# Patient Record
Sex: Male | Born: 1975 | Race: White | Hispanic: No | Marital: Married | Smoking: Former smoker
Health system: Southern US, Community
[De-identification: ages and names within clinical notes are randomized; demographics above are authoritative.]

## PROBLEM LIST (undated history)

## (undated) DIAGNOSIS — I10 Essential (primary) hypertension: Secondary | ICD-10-CM

## (undated) DIAGNOSIS — F419 Anxiety disorder, unspecified: Secondary | ICD-10-CM

## (undated) DIAGNOSIS — N200 Calculus of kidney: Secondary | ICD-10-CM

## (undated) DIAGNOSIS — E785 Hyperlipidemia, unspecified: Secondary | ICD-10-CM

## (undated) DIAGNOSIS — T7840XA Allergy, unspecified, initial encounter: Secondary | ICD-10-CM

## (undated) DIAGNOSIS — T8859XA Other complications of anesthesia, initial encounter: Secondary | ICD-10-CM

## (undated) DIAGNOSIS — M1990 Unspecified osteoarthritis, unspecified site: Secondary | ICD-10-CM

## (undated) HISTORY — PX: COLONOSCOPY: SHX174

## (undated) HISTORY — DX: Hyperlipidemia, unspecified: E78.5

## (undated) HISTORY — PX: KNEE ARTHROSCOPY: SUR90

## (undated) HISTORY — PX: HYDROCELE EXCISION: SHX482

## (undated) HISTORY — DX: Allergy, unspecified, initial encounter: T78.40XA

## (undated) HISTORY — DX: Essential (primary) hypertension: I10

---

## 1999-01-17 ENCOUNTER — Emergency Department (HOSPITAL_COMMUNITY): Admission: EM | Admit: 1999-01-17 | Discharge: 1999-01-17 | Payer: Self-pay | Admitting: Emergency Medicine

## 2002-02-27 ENCOUNTER — Encounter: Payer: Self-pay | Admitting: Family Medicine

## 2002-02-27 ENCOUNTER — Encounter: Admission: RE | Admit: 2002-02-27 | Discharge: 2002-02-27 | Payer: Self-pay | Admitting: Family Medicine

## 2005-05-01 ENCOUNTER — Ambulatory Visit: Payer: Self-pay | Admitting: Family Medicine

## 2005-07-24 ENCOUNTER — Ambulatory Visit: Payer: Self-pay | Admitting: Family Medicine

## 2005-07-31 ENCOUNTER — Ambulatory Visit: Payer: Self-pay | Admitting: Family Medicine

## 2005-08-14 ENCOUNTER — Ambulatory Visit: Payer: Self-pay | Admitting: Family Medicine

## 2005-10-04 ENCOUNTER — Ambulatory Visit: Payer: Self-pay | Admitting: Family Medicine

## 2006-05-16 ENCOUNTER — Ambulatory Visit: Payer: Self-pay | Admitting: Family Medicine

## 2006-08-02 ENCOUNTER — Ambulatory Visit: Payer: Self-pay | Admitting: Family Medicine

## 2006-08-02 LAB — CONVERTED CEMR LAB
ALT: 43 units/L — ABNORMAL HIGH (ref 0–40)
AST: 32 units/L (ref 0–37)
Albumin: 4.3 g/dL (ref 3.5–5.2)
Alkaline Phosphatase: 41 units/L (ref 39–117)
BUN: 9 mg/dL (ref 6–23)
Basophils Absolute: 0 10*3/uL (ref 0.0–0.1)
Basophils Relative: 0.4 % (ref 0.0–1.0)
CO2: 31 meq/L (ref 19–32)
Calcium: 9.7 mg/dL (ref 8.4–10.5)
Chloride: 106 meq/L (ref 96–112)
Chol/HDL Ratio, serum: 4.7
Cholesterol: 161 mg/dL (ref 0–200)
Creatinine, Ser: 0.9 mg/dL (ref 0.4–1.5)
Eosinophil percent: 3.7 % (ref 0.0–5.0)
GFR calc non Af Amer: 105 mL/min
Glomerular Filtration Rate, Af Am: 127 mL/min/{1.73_m2}
Glucose, Bld: 86 mg/dL (ref 70–99)
HCT: 49.9 % (ref 39.0–52.0)
HDL: 34.3 mg/dL — ABNORMAL LOW (ref >39.0)
Hemoglobin: 16.5 g/dL (ref 13.0–17.0)
LDL Cholesterol: 111 mg/dL — ABNORMAL HIGH (ref 0–99)
Lymphocytes Relative: 36 % (ref 12.0–46.0)
MCHC: 33 g/dL (ref 30.0–36.0)
MCV: 92.7 fL (ref 78.0–100.0)
Monocytes Absolute: 0.5 10*3/uL (ref 0.2–0.7)
Monocytes Relative: 6.9 % (ref 3.0–11.0)
Neutro Abs: 3.5 10*3/uL (ref 1.4–7.7)
Neutrophils Relative %: 53 % (ref 43.0–77.0)
Platelets: 351 10*3/uL (ref 150–400)
Potassium: 4.3 meq/L (ref 3.5–5.1)
RBC: 5.38 M/uL (ref 4.22–5.81)
RDW: 11.9 % (ref 11.5–14.6)
Sodium: 142 meq/L (ref 135–145)
TSH: 1.25 microintl units/mL (ref 0.35–5.50)
Total Bilirubin: 1.4 mg/dL — ABNORMAL HIGH (ref 0.3–1.2)
Total Protein: 7.3 g/dL (ref 6.0–8.3)
Triglyceride fasting, serum: 81 mg/dL (ref 0–149)
VLDL: 16 mg/dL (ref 0–40)
WBC: 6.6 10*3/uL (ref 4.5–10.5)

## 2006-08-09 ENCOUNTER — Ambulatory Visit: Payer: Self-pay | Admitting: Family Medicine

## 2006-11-19 ENCOUNTER — Ambulatory Visit: Payer: Self-pay | Admitting: Family Medicine

## 2006-11-22 ENCOUNTER — Ambulatory Visit: Payer: Self-pay | Admitting: Family Medicine

## 2006-11-22 LAB — CONVERTED CEMR LAB
ALT: 39 units/L (ref 0–40)
AST: 28 units/L (ref 0–37)
Albumin: 3.8 g/dL (ref 3.5–5.2)
Alkaline Phosphatase: 41 units/L (ref 39–117)
BUN: 12 mg/dL (ref 6–23)
Basophils Absolute: 0.1 10*3/uL (ref 0.0–0.1)
Basophils Relative: 1.6 % — ABNORMAL HIGH (ref 0.0–1.0)
Bilirubin, Direct: 0.2 mg/dL (ref 0.0–0.3)
CO2: 30 meq/L (ref 19–32)
Calcium: 8.9 mg/dL (ref 8.4–10.5)
Chloride: 106 meq/L (ref 96–112)
Creatinine, Ser: 0.9 mg/dL (ref 0.4–1.5)
Eosinophils Absolute: 0 10*3/uL (ref 0.0–0.6)
Eosinophils Relative: 0.4 % (ref 0.0–5.0)
GFR calc Af Amer: 127 mL/min
GFR calc non Af Amer: 105 mL/min
Glucose, Bld: 102 mg/dL — ABNORMAL HIGH (ref 70–99)
HCT: 46.1 % (ref 39.0–52.0)
Hemoglobin: 15.7 g/dL (ref 13.0–17.0)
Lymphocytes Relative: 20.8 % (ref 12.0–46.0)
MCHC: 34.1 g/dL (ref 30.0–36.0)
MCV: 89.4 fL (ref 78.0–100.0)
Monocytes Absolute: 0.5 10*3/uL (ref 0.2–0.7)
Monocytes Relative: 8.5 % (ref 3.0–11.0)
Neutro Abs: 4 10*3/uL (ref 1.4–7.7)
Neutrophils Relative %: 68.7 % (ref 43.0–77.0)
Platelets: 227 10*3/uL (ref 150–400)
Potassium: 4 meq/L (ref 3.5–5.1)
RBC: 5.16 M/uL (ref 4.22–5.81)
RDW: 11.8 % (ref 11.5–14.6)
Sodium: 141 meq/L (ref 135–145)
Total Bilirubin: 0.9 mg/dL (ref 0.3–1.2)
Total Protein: 7 g/dL (ref 6.0–8.3)
WBC: 5.8 10*3/uL (ref 4.5–10.5)

## 2006-12-02 ENCOUNTER — Ambulatory Visit: Payer: Self-pay | Admitting: Family Medicine

## 2007-01-23 ENCOUNTER — Encounter: Payer: Self-pay | Admitting: Family Medicine

## 2007-01-23 DIAGNOSIS — J309 Allergic rhinitis, unspecified: Secondary | ICD-10-CM | POA: Insufficient documentation

## 2007-01-23 DIAGNOSIS — J45909 Unspecified asthma, uncomplicated: Secondary | ICD-10-CM | POA: Insufficient documentation

## 2007-01-23 DIAGNOSIS — I10 Essential (primary) hypertension: Secondary | ICD-10-CM

## 2007-01-23 DIAGNOSIS — E785 Hyperlipidemia, unspecified: Secondary | ICD-10-CM

## 2007-08-15 ENCOUNTER — Ambulatory Visit: Payer: Self-pay | Admitting: Family Medicine

## 2007-08-15 LAB — CONVERTED CEMR LAB
ALT: 57 units/L — ABNORMAL HIGH (ref 0–53)
AST: 32 units/L (ref 0–37)
Albumin: 4.2 g/dL (ref 3.5–5.2)
Alkaline Phosphatase: 39 units/L (ref 39–117)
BUN: 8 mg/dL (ref 6–23)
Basophils Absolute: 0 10*3/uL (ref 0.0–0.1)
Basophils Relative: 0.6 % (ref 0.0–1.0)
Bilirubin Urine: NEGATIVE
Bilirubin, Direct: 0.2 mg/dL (ref 0.0–0.3)
Blood in Urine, dipstick: NEGATIVE
CO2: 32 meq/L (ref 19–32)
Calcium: 9.5 mg/dL (ref 8.4–10.5)
Chloride: 103 meq/L (ref 96–112)
Cholesterol: 165 mg/dL (ref 0–200)
Creatinine, Ser: 1 mg/dL (ref 0.4–1.5)
Eosinophils Absolute: 0.2 10*3/uL (ref 0.0–0.6)
Eosinophils Relative: 3.8 % (ref 0.0–5.0)
GFR calc Af Amer: 112 mL/min
GFR calc non Af Amer: 93 mL/min
Glucose, Bld: 95 mg/dL (ref 70–99)
Glucose, Urine, Semiquant: NEGATIVE
HCT: 43.8 % (ref 39.0–52.0)
HDL: 32.1 mg/dL — ABNORMAL LOW (ref >39.0)
Hemoglobin: 15.6 g/dL (ref 13.0–17.0)
Ketones, urine, test strip: NEGATIVE
LDL Cholesterol: 114 mg/dL — ABNORMAL HIGH (ref 0–99)
Lymphocytes Relative: 44.2 % (ref 12.0–46.0)
MCHC: 35.6 g/dL (ref 30.0–36.0)
MCV: 88.3 fL (ref 78.0–100.0)
Monocytes Absolute: 0.4 10*3/uL (ref 0.2–0.7)
Monocytes Relative: 8.1 % (ref 3.0–11.0)
Neutro Abs: 1.9 10*3/uL (ref 1.4–7.7)
Neutrophils Relative %: 43.3 % (ref 43.0–77.0)
Nitrite: NEGATIVE
Platelets: 310 10*3/uL (ref 150–400)
Potassium: 4.6 meq/L (ref 3.5–5.1)
RBC: 4.96 M/uL (ref 4.22–5.81)
RDW: 11.5 % (ref 11.5–14.6)
Sodium: 141 meq/L (ref 135–145)
Specific Gravity, Urine: 1.015
TSH: 1.22 microintl units/mL (ref 0.35–5.50)
Total Bilirubin: 1 mg/dL (ref 0.3–1.2)
Total CHOL/HDL Ratio: 5.1
Total Protein: 6.7 g/dL (ref 6.0–8.3)
Triglycerides: 93 mg/dL (ref 0–149)
Urobilinogen, UA: 0.2
VLDL: 19 mg/dL (ref 0–40)
WBC Urine, dipstick: NEGATIVE
WBC: 4.4 10*3/uL — ABNORMAL LOW (ref 4.5–10.5)
pH: 8.5

## 2007-08-22 ENCOUNTER — Ambulatory Visit: Payer: Self-pay | Admitting: Family Medicine

## 2008-07-27 ENCOUNTER — Ambulatory Visit: Payer: Self-pay | Admitting: Family Medicine

## 2008-08-12 ENCOUNTER — Ambulatory Visit: Payer: Self-pay | Admitting: Family Medicine

## 2008-08-12 LAB — CONVERTED CEMR LAB
ALT: 42 units/L (ref 0–53)
AST: 23 units/L (ref 0–37)
Albumin: 4 g/dL (ref 3.5–5.2)
Alkaline Phosphatase: 43 units/L (ref 39–117)
BUN: 12 mg/dL (ref 6–23)
Basophils Absolute: 0 10*3/uL (ref 0.0–0.1)
Basophils Relative: 0 % (ref 0.0–3.0)
Bilirubin Urine: NEGATIVE
Bilirubin, Direct: 0.1 mg/dL (ref 0.0–0.3)
Blood in Urine, dipstick: NEGATIVE
CO2: 29 meq/L (ref 19–32)
Calcium: 9.4 mg/dL (ref 8.4–10.5)
Chloride: 105 meq/L (ref 96–112)
Cholesterol: 193 mg/dL (ref 0–200)
Creatinine, Ser: 0.8 mg/dL (ref 0.4–1.5)
Eosinophils Absolute: 0.1 10*3/uL (ref 0.0–0.7)
Eosinophils Relative: 1.9 % (ref 0.0–5.0)
GFR calc Af Amer: 144 mL/min
GFR calc non Af Amer: 119 mL/min
Glucose, Bld: 78 mg/dL (ref 70–99)
Glucose, Urine, Semiquant: NEGATIVE
HCT: 45.6 % (ref 39.0–52.0)
HDL: 40.3 mg/dL (ref >39.0)
Hemoglobin: 15.8 g/dL (ref 13.0–17.0)
Ketones, urine, test strip: NEGATIVE
LDL Cholesterol: 133 mg/dL — ABNORMAL HIGH (ref 0–99)
Lymphocytes Relative: 30.3 % (ref 12.0–46.0)
MCHC: 34.7 g/dL (ref 30.0–36.0)
MCV: 90 fL (ref 78.0–100.0)
Monocytes Absolute: 0.4 10*3/uL (ref 0.1–1.0)
Monocytes Relative: 6 % (ref 3.0–12.0)
Neutro Abs: 3.8 10*3/uL (ref 1.4–7.7)
Neutrophils Relative %: 61.8 % (ref 43.0–77.0)
Nitrite: NEGATIVE
Platelets: 301 10*3/uL (ref 150–400)
Potassium: 3.9 meq/L (ref 3.5–5.1)
Protein, U semiquant: NEGATIVE
RBC: 5.07 M/uL (ref 4.22–5.81)
RDW: 12.1 % (ref 11.5–14.6)
Sodium: 141 meq/L (ref 135–145)
Specific Gravity, Urine: 1.02
TSH: 0.95 microintl units/mL (ref 0.35–5.50)
Total Bilirubin: 1.2 mg/dL (ref 0.3–1.2)
Total CHOL/HDL Ratio: 4.8
Total Protein: 7.2 g/dL (ref 6.0–8.3)
Triglycerides: 100 mg/dL (ref 0–149)
Urobilinogen, UA: 0.2
VLDL: 20 mg/dL (ref 0–40)
WBC Urine, dipstick: NEGATIVE
WBC: 6.1 10*3/uL (ref 4.5–10.5)
pH: 6

## 2008-08-20 ENCOUNTER — Ambulatory Visit: Payer: Self-pay | Admitting: Family Medicine

## 2009-09-02 ENCOUNTER — Telehealth: Payer: Self-pay | Admitting: Family Medicine

## 2009-09-06 ENCOUNTER — Ambulatory Visit: Payer: Self-pay | Admitting: Family Medicine

## 2009-09-06 LAB — CONVERTED CEMR LAB
ALT: 45 units/L (ref 0–53)
AST: 29 units/L (ref 0–37)
Albumin: 4.2 g/dL (ref 3.5–5.2)
Alkaline Phosphatase: 36 units/L — ABNORMAL LOW (ref 39–117)
BUN: 7 mg/dL (ref 6–23)
Basophils Absolute: 0 10*3/uL (ref 0.0–0.1)
Basophils Relative: 1 % (ref 0.0–3.0)
Bilirubin Urine: NEGATIVE
Bilirubin, Direct: 0 mg/dL (ref 0.0–0.3)
Blood in Urine, dipstick: NEGATIVE
CO2: 33 meq/L — ABNORMAL HIGH (ref 19–32)
Calcium: 9.3 mg/dL (ref 8.4–10.5)
Chloride: 107 meq/L (ref 96–112)
Cholesterol: 158 mg/dL (ref 0–200)
Creatinine, Ser: 1 mg/dL (ref 0.4–1.5)
Eosinophils Absolute: 0.3 10*3/uL (ref 0.0–0.7)
Eosinophils Relative: 5.2 % — ABNORMAL HIGH (ref 0.0–5.0)
GFR calc non Af Amer: 91.25 mL/min (ref >60)
Glucose, Bld: 87 mg/dL (ref 70–99)
Glucose, Urine, Semiquant: NEGATIVE
HCT: 48.1 % (ref 39.0–52.0)
HDL: 40.1 mg/dL (ref >39.00)
Hemoglobin: 15.8 g/dL (ref 13.0–17.0)
Ketones, urine, test strip: NEGATIVE
LDL Cholesterol: 93 mg/dL (ref 0–99)
Lymphocytes Relative: 40.8 % (ref 12.0–46.0)
Lymphs Abs: 2 10*3/uL (ref 0.7–4.0)
MCHC: 32.8 g/dL (ref 30.0–36.0)
MCV: 91.4 fL (ref 78.0–100.0)
Monocytes Absolute: 0.3 10*3/uL (ref 0.1–1.0)
Monocytes Relative: 6.2 % (ref 3.0–12.0)
Neutro Abs: 2.3 10*3/uL (ref 1.4–7.7)
Neutrophils Relative %: 46.8 % (ref 43.0–77.0)
Nitrite: NEGATIVE
Platelets: 274 10*3/uL (ref 150.0–400.0)
Potassium: 4.6 meq/L (ref 3.5–5.1)
RBC: 5.27 M/uL (ref 4.22–5.81)
RDW: 12 % (ref 11.5–14.6)
Sodium: 143 meq/L (ref 135–145)
Specific Gravity, Urine: 1.02
TSH: 1.15 microintl units/mL (ref 0.35–5.50)
Total Bilirubin: 0.6 mg/dL (ref 0.3–1.2)
Total CHOL/HDL Ratio: 4
Total Protein: 6.8 g/dL (ref 6.0–8.3)
Triglycerides: 124 mg/dL (ref 0.0–149.0)
Urobilinogen, UA: 0.2
VLDL: 24.8 mg/dL (ref 0.0–40.0)
WBC Urine, dipstick: NEGATIVE
WBC: 4.9 10*3/uL (ref 4.5–10.5)
pH: 7.5

## 2009-09-13 ENCOUNTER — Ambulatory Visit: Payer: Self-pay | Admitting: Family Medicine

## 2010-07-19 ENCOUNTER — Ambulatory Visit
Admission: RE | Admit: 2010-07-19 | Discharge: 2010-07-19 | Payer: Self-pay | Source: Home / Self Care | Attending: Internal Medicine | Admitting: Internal Medicine

## 2010-07-19 DIAGNOSIS — J069 Acute upper respiratory infection, unspecified: Secondary | ICD-10-CM | POA: Insufficient documentation

## 2010-08-22 NOTE — Progress Notes (Signed)
Summary: REQ FOR REFILL ON MEDS  Phone Note Refill Request   Refills Requested: Medication #1:  ZOCOR 40 MG TABS 1 tab @ bedtime.  Medication #2:  CARDURA 8 MG TABS 1 tab @ bedtime Initial call taken by: Kern Reap CMA Duncan Dull),  September 02, 2009 8:52 AM Caller: Patient [phone] Reason for Call: Talk to Nurse, Talk to Doctor Summary of Call: Pt called to see if he can obtain a refill RX for meds:  DOXAZOSIN MESYLATE 4 MG TAB   -and-   SIMVASTATIN 40 MG TABLET...... Pt adv that he RX was denied because he needed to schedule an OV but pt has appt for CPX labs on 09/06/09 and appt for CPX w/  Dr. Tawanna Cooler on 09/13/2009.... Pt adv that he is completely out of these meds and would like to have RX (or atleast enough to do him till his cpx appt)  for same sent to [facility]  Initial call taken by: Debbra Riding,  September 02, 2009 8:26 AM    Prescriptions: ZOCOR 40 MG TABS (SIMVASTATIN) 1 tab @ bedtime  #30 x 0   Entered by:   Kern Reap CMA (AAMA)   Authorized by:   Roderick Pee MD   Signed by:   Kern Reap CMA (AAMA) on 09/02/2009   Method used:   Electronically to        [id number] (retail)       [address]       [place], [place]  [address]       Ph: [phone] or [phone]       Fax: [phone]   RxID:   [id number] CARDURA 8 MG TABS (DOXAZOSIN MESYLATE) 1 tab @ bedtime  #30 x 0   Entered by:   Kern Reap CMA (AAMA)   Authorized by:   Roderick Pee MD   Signed by:   Kern Reap CMA (AAMA) on 09/02/2009   Method used:   Electronically to        [id number] (retail)       [address]       [place], [place]  [address]       Ph: [phone] or [phone]       Fax: [phone]   RxID:   [id number]

## 2010-08-22 NOTE — Assessment & Plan Note (Signed)
Summary: CPX/CJR   Vital Signs:  Patient profile:   35 year old male Height:      70 inches Weight:      270 pounds BMI:     38.88 Temp:     98.9 degrees F oral BP sitting:   150 / 98  (left arm) Cuff size:   regular  Vitals Entered By: Kern Reap CMA Duncan Dull) (September 13, 2009 9:36 AM)  Reason for Visit cpx  History of Present Illness: Jim Goodwin is a 35 year old, married male, with a 20-year-old and a 52 week old at home, who comes in today for evaluation of hypertension, hyperlipidemia, and allergic rhinitis.  His hypertension, history with Cardura 8 mg nightly, BP 150/98............ BP by me 130/90.  Asked him to monitor his blood pressure daily for the next 3 to 4 weeks and fax the report.  His hyperlipidemia is managed was Zocor 40 mg nightly lipids are goal with an LDL of 93.  His allergic rhinitis history with Flonase nasal spray and Zyrtec 10 mg daily.  Tetanus booster 2007 seasonal flu 2010.  Review of systems negative  Allergies: 1)  * Duracef  Past History:  Past medical, surgical, family and social histories (including risk factors) reviewed, and no changes noted (except as noted below).  Past Medical History: Reviewed history from 01/23/2007 and no changes required. Allergic rhinitis Hypertension Hyperlipidemia Asthma  Past Surgical History: Reviewed history from 01/23/2007 and no changes required.   L  arthroscopy-98 hydrocele removed 93-94'  Family History: Reviewed history from 08/22/2007 and no changes required. Family History Hypertension father had kidney stones, one sister in good health  Social History: Reviewed history from 08/22/2007 and no changes required. Occupation: Married Never Smoked Alcohol use-no Drug use-no Regular exercise-no  Review of Systems      See HPI  Physical Exam  General:  Well-developed,well-nourished,in no acute distress; alert,appropriate and cooperative throughout examination Head:  Normocephalic and  atraumatic without obvious abnormalities. No apparent alopecia or balding. Eyes:  No corneal or conjunctival inflammation noted. EOMI. Perrla. Funduscopic exam benign, without hemorrhages, exudates or papilledema. Vision grossly normal. Ears:  External ear exam shows no significant lesions or deformities.  Otoscopic examination reveals clear canals, tympanic membranes are intact bilaterally without bulging, retraction, inflammation or discharge. Hearing is grossly normal bilaterally. Nose:  External nasal examination shows no deformity or inflammation. Nasal mucosa are pink and moist without lesions or exudates. Mouth:  Oral mucosa and oropharynx without lesions or exudates.  Teeth in good repair. Neck:  No deformities, masses, or tenderness noted. Chest Wall:  No deformities, masses, tenderness or gynecomastia noted. Breasts:  No masses or gynecomastia noted Lungs:  Normal respiratory effort, chest expands symmetrically. Lungs are clear to auscultation, no crackles or wheezes. Heart:  Normal rate and regular rhythm. S1 and S2 normal without gallop, murmur, click, rub or other extra sounds. Abdomen:  Bowel sounds positive,abdomen soft and non-tender without masses, organomegaly or hernias noted. Genitalia:  Testes bilaterally descended without nodularity, tenderness or masses. No scrotal masses or lesions. No penis lesions or urethral discharge. Msk:  No deformity or scoliosis noted of thoracic or lumbar spine.   Pulses:  R and L carotid,radial,femoral,dorsalis pedis and posterior tibial pulses are full and equal bilaterally Extremities:  No clubbing, cyanosis, edema, or deformity noted with normal full range of motion of all joints.   Neurologic:  No cranial nerve deficits noted. Station and gait are normal. Plantar reflexes are down-going bilaterally. DTRs are symmetrical throughout. Sensory, motor  and coordinative functions appear intact. Skin:  Intact without suspicious lesions or  rashes Cervical Nodes:  No lymphadenopathy noted Axillary Nodes:  No palpable lymphadenopathy Inguinal Nodes:  No significant adenopathy Psych:  Cognition and judgment appear intact. Alert and cooperative with normal attention span and concentration. No apparent delusions, illusions, hallucinations   Impression & Recommendations:  Problem # 1:  PHYSICAL EXAMINATION (ICD-V70.0) Assessment Unchanged  Orders: Prescription Created Electronically [phone])  Problem # 2:  HYPERLIPIDEMIA (ICD-272.4) Assessment: Improved  His updated medication list for this problem includes:    Zocor 40 Mg Tabs (Simvastatin) .Marland Kitchen... 1 tab @ bedtime  Orders: Prescription Created Electronically [phone]) EKG w/ Interpretation ([address])  Problem # 3:  HYPERTENSION (ICD-401.9) Assessment: Deteriorated  His updated medication list for this problem includes:    Cardura 8 Mg Tabs (Doxazosin mesylate) .Marland Kitchen... 1 tab @ bedtime  Orders: Prescription Created Electronically [phone]) EKG w/ Interpretation ([address])  Problem # 4:  ALLERGIC RHINITIS (ICD-477.9) Assessment: Improved  His updated medication list for this problem includes:    Flonase 50 Mcg/act Susp (Fluticasone propionate) ..... Spray 1 spray into both nostrils once  a day    Zyrtec Allergy 10 Mg Tabs (Cetirizine hcl) .Marland Kitchen... Take 1 tablet by mouth once a day; otc  Orders: Prescription Created Electronically [phone])  Complete Medication List: 1)  Flonase 50 Mcg/act Susp (Fluticasone propionate) .... Spray 1 spray into both nostrils once  a day 2)  Zyrtec Allergy 10 Mg Tabs (Cetirizine hcl) .... Take 1 tablet by mouth once a day; otc 3)  Cardura 8 Mg Tabs (Doxazosin mesylate) .Marland Kitchen.. 1 tab @ bedtime 4)  Zocor 40 Mg Tabs (Simvastatin) .Marland Kitchen.. 1 tab @ bedtime  Patient Instructions: 1)  check your blood pressure daily for 4 weeks in the morning.  Fax me the report at [phone], and I will call you to discuss any potential changes. 2)  If you could become pregnant,  take a multivitamin with folic acid every day. 3)  Please schedule a follow-up appointment in 1 year. Prescriptions: ZOCOR 40 MG TABS (SIMVASTATIN) 1 tab @ bedtime  #100 x 3   Entered and Authorized by:   Roderick Pee MD   Signed by:   Roderick Pee MD on 09/13/2009   Method used:   Electronically to        [id number] (retail)       [address]       [place], [place]  [address]       Ph: [phone] or [phone]       Fax: [phone]   RxID:   [id number] CARDURA 8 MG TABS (DOXAZOSIN MESYLATE) 1 tab @ bedtime  #100 x 3   Entered and Authorized by:   Roderick Pee MD   Signed by:   Roderick Pee MD on 09/13/2009   Method used:   Electronically to        [id number] (retail)       [address]       [place], [place]  [address]       Ph: [phone] or [phone]       Fax: [phone]   RxID:   [id number] FLONASE 50 MCG/ACT SUSP (FLUTICASONE PROPIONATE) Spray 1 spray into both nostrils once  a day  #3 x 4   Entered and Authorized by:   Roderick Pee MD   Signed by:   Roderick Pee MD on 09/13/2009   Method used:  Electronically to        [id number] (retail)       [address]       [place], [place]  [address]       Ph: [phone] or [phone]       Fax: [phone]   RxID:   [id number]    Immunization History:  Influenza Immunization History:    Influenza:  historical (04/22/2009)

## 2010-08-24 NOTE — Assessment & Plan Note (Signed)
Summary: cough/njr   Vital Signs:  Patient profile:   35 year old male Height:      70 inches Weight:      272 pounds BMI:     39.17 O2 Sat:      98 % on Room air Temp:     98.6 degrees F oral Pulse rate:   104 / minute BP sitting:   130 / 90  (right arm) Cuff size:   large  Vitals Entered By: Romualdo Bolk, CMA (AAMA) (July 19, 2010 3:47 PM)  O2 Flow:  Room air CC: Coughing since 12/21. Pt thinks that it may just be viral or allergy. Pt did have a slight fever at first and took coricidin hbp.   History of Present Illness: Jim Goodwin comes in today  for acute problem.  About a week ago  12 21 had onset of feverish and cold signs and now coughing and hoarse .   sinus pressure is actuallly better but having fairly dry cough   and to travel soon. NO one sick at home. Has hx of allergic rhintis but denies asthma and inhaler use  . NO tobacco . uses flonase at times not recently . sometimes gets nosebleeds but otherwise helps him .  Ex tobacco 4 years ago after 15 years.   Preventive Screening-Counseling & Management  Alcohol-Tobacco     Smoking Status: quit     Tobacco Counseling: to remain off tobacco products  Caffeine-Diet-Exercise     Does Patient Exercise: no  Comments: 2007  after 15 years  Current Medications (verified): 1)  Flonase 50 Mcg/act Susp (Fluticasone Propionate) .... Spray 1 Spray Into Both Nostrils Once  A Day 2)  Zyrtec Allergy 10 Mg Tabs (Cetirizine Hcl) .... Take 1 Tablet By Mouth Once A Day; Otc 3)  Cardura 8 Mg Tabs (Doxazosin Mesylate) .Marland Kitchen.. 1 Tab @ Bedtime 4)  Zocor 40 Mg Tabs (Simvastatin) .Marland Kitchen.. 1 Tab @ Bedtime  Allergies (verified): 1)  * Duracef  Past History:  Past medical, surgical, family and social histories (including risk factors) reviewed, and no changes noted (except as noted below).  Past Medical History: Allergic rhinitis Hypertension Hyperlipidemia Asthma  ? never rx with inhalers?   Past Surgical  History: Reviewed history from 01/23/2007 and no changes required.   L  arthroscopy-98 hydrocele removed 93-94'  Family History: Reviewed history from 08/22/2007 and no changes required. Family History Hypertension father had kidney stones, one sister in good health  Social History: Reviewed history from 08/22/2007 and no changes required. Occupation: Married  Alcohol use-no Drug use-no Regular exercise-no Former Smoker Smoking Status:  quit  Review of Systems       The patient complains of hoarseness.  The patient denies anorexia, fever, weight loss, weight gain, vision loss, decreased hearing, chest pain, dyspnea on exertion, prolonged cough, hemoptysis, enlarged lymph nodes, and angioedema.    Physical Exam  General:  midldy hoarse non toxic in nad  Head:  normocephalic and atraumatic.   Eyes:  clear  Ears:  R ear normal, L ear normal, and no external deformities.   Nose:  no external deformity and no external erythema.  congested no pain Mouth:  pharynx pink and moist.   Neck:  No deformities, masses, or tenderness noted. Lungs:  Normal respiratory effort, chest expands symmetrically. Lungs are clear to auscultation, no crackles or wheezes.no dullness.   Heart:  Normal rate and regular rhythm. S1 and S2 normal without gallop, murmur, click, rub or other  extra sounds.no lifts.   Pulses:  nl cap refill  Skin:  turgor normal, color normal, no ecchymoses, and no petechiae.   Cervical Nodes:  No lymphadenopathy noted Psych:  Oriented X3, good eye contact, not anxious appearing, and not depressed appearing.     Impression & Recommendations:  Problem # 1:  URI (ICD-465.9) prob viral with  coughing lft.sx     Expectant management and call with alarm f\symptoms  or as needed  no current wheezing or  rakes His updated medication list for this problem includes:    Zyrtec Allergy 10 Mg Tabs (Cetirizine hcl) .Marland Kitchen... Take 1 tablet by mouth once a day; otc    Hydromet 5-1.5 Mg/35ml  Syrp (Hydrocodone-homatropine) .Marland Kitchen... 1-2 tsp by mouth q4-6 hours as needed cough  Problem # 2:  ALLERGIC RHINITIS (ICD-477.9) disc use of flonase  consider  other preps if getting nose bleeds His updated medication list for this problem includes:    Flonase 50 Mcg/act Susp (Fluticasone propionate) ..... Spray 1 spray into both nostrils once  a day    Zyrtec Allergy 10 Mg Tabs (Cetirizine hcl) .Marland Kitchen... Take 1 tablet by mouth once a day; otc  Complete Medication List: 1)  Flonase 50 Mcg/act Susp (Fluticasone propionate) .... Spray 1 spray into both nostrils once  a day 2)  Zyrtec Allergy 10 Mg Tabs (Cetirizine hcl) .... Take 1 tablet by mouth once a day; otc 3)  Cardura 8 Mg Tabs (Doxazosin mesylate) .Marland Kitchen.. 1 tab @ bedtime 4)  Zocor 40 Mg Tabs (Simvastatin) .Marland Kitchen.. 1 tab @ bedtime 5)  Hydromet 5-1.5 Mg/46ml Syrp (Hydrocodone-homatropine) .Marland Kitchen.. 1-2 tsp by mouth q4-6 hours as needed cough  Patient Instructions: 1)  i think this is a viral respiratory infection. 2)  cough will probably last another week but should be feeling better after that. 3)  call if fever shortness of breath or other concerns. 4)  saline nose spray and flonase as tolerated  Prescriptions: HYDROMET 5-1.5 MG/5ML SYRP (HYDROCODONE-HOMATROPINE) 1-2 tsp by mouth q4-6 hours as needed cough  #6 oz x 0   Entered and Authorized by:   Madelin Headings MD   Signed by:   Madelin Headings MD on 07/19/2010   Method used:   Print then Give to Patient   RxID:   [id number]    Orders Added: 1)  Est. Patient Level III [62952]    Prevention & Chronic Care Immunizations   Influenza vaccine: Historical  (04/22/2009)    Tetanus booster: 07/31/2005: Td    Pneumococcal vaccine: Not documented  Other Screening   Smoking status: quit  (07/19/2010)  Lipids   Total Cholesterol: 158  (09/06/2009)   LDL: 93  (09/06/2009)   LDL Direct: Not documented   HDL: 40.10  (09/06/2009)   Triglycerides: 124.0  (09/06/2009)    SGOT (AST): 29   (09/06/2009)   SGPT (ALT): 45  (09/06/2009)   Alkaline phosphatase: 36  (09/06/2009)   Total bilirubin: 0.6  (09/06/2009)  Hypertension   Last Blood Pressure: 130 / 90  (07/19/2010)   Serum creatinine: 1.0  (09/06/2009)   Serum potassium 4.6  (09/06/2009)  Self-Management Support :    Hypertension self-management support: Not documented    Lipid self-management support: Not documented

## 2010-08-26 ENCOUNTER — Ambulatory Visit (INDEPENDENT_AMBULATORY_CARE_PROVIDER_SITE_OTHER): Payer: BC Managed Care – PPO | Admitting: Family Medicine

## 2010-08-26 ENCOUNTER — Encounter: Payer: Self-pay | Admitting: Family Medicine

## 2010-08-26 DIAGNOSIS — R109 Unspecified abdominal pain: Secondary | ICD-10-CM

## 2010-08-26 DIAGNOSIS — N200 Calculus of kidney: Secondary | ICD-10-CM | POA: Insufficient documentation

## 2010-08-26 LAB — CONVERTED CEMR LAB
Blood in Urine, dipstick: NEGATIVE
Ketones, urine, test strip: NEGATIVE
Nitrite: NEGATIVE
Specific Gravity, Urine: 1.025
Urobilinogen, UA: 0.2

## 2010-08-28 ENCOUNTER — Ambulatory Visit (INDEPENDENT_AMBULATORY_CARE_PROVIDER_SITE_OTHER): Payer: BC Managed Care – PPO | Admitting: Family Medicine

## 2010-08-28 ENCOUNTER — Encounter: Payer: Self-pay | Admitting: Family Medicine

## 2010-08-28 VITALS — BP 120/84 | Temp 98.5°F | Ht 69.0 in | Wt 270.0 lb

## 2010-08-28 DIAGNOSIS — M549 Dorsalgia, unspecified: Secondary | ICD-10-CM

## 2010-08-28 DIAGNOSIS — N200 Calculus of kidney: Secondary | ICD-10-CM

## 2010-08-28 LAB — POCT URINALYSIS DIPSTICK
Leukocytes, UA: NEGATIVE
Nitrite, UA: NEGATIVE
Protein, UA: NEGATIVE
Urobilinogen, UA: NEGATIVE
pH, UA: 6

## 2010-08-28 NOTE — Progress Notes (Signed)
  Subjective:    Patient ID: [name], male    DOB: 03/25/1976, 35 y.o.   MRN: [id number]  HPIrichard Is a 35 year old male, who comes in today for follow-up after having passed his fourth kidney stone.  He states on Friday night, about 8 p.m. He began developing flank pain.  He went to the Saturday clinic and they felt he had a kidney stone.  Interestingly enough, his urine showed no blood.  Yesterday, the pain suddenly radiates to the groin and went away.  He does not recall passing a stone.  This is his fourth stone.  The second stone to about 6 weeks to pass.  He had a urology consult at that time.  No intervention.  Review of systems otherwise negative except he doesn't drink enough water.  His urine is real dark and concentrated   Review of Systems    Negative Objective:   Physical Exam    In general, he's well-developed well-nourished, male in no acute distress    Assessment & Plan:  Kidney stone.  In the future, and going forward and drank about 24 ounces of water daily.  Return p.r.n.

## 2010-08-28 NOTE — Patient Instructions (Signed)
Drink 24 ounces of water daily.  Return p.r.n.

## 2010-08-30 NOTE — Assessment & Plan Note (Signed)
Summary: kidney stones/RCD   Vital Signs:  Patient profile:   35 year old male Weight:      272.50 pounds BMI:     39.24 Temp:     98.6 degrees F oral Pulse rate:   96 / minute Pulse rhythm:   regular BP sitting:   138 / 84  (left arm) Cuff size:   large  Vitals Entered By: Selena Batten Dance CMA Duncan Dull) (August 26, 2010 12:48 PM) CC: ? Kidney stone   History of Present Illness: has been having some muscle spasms / stomach cramps through the week  some constipated -- is better now  now pain is L flank and worse-- feels exactly like a kidney stone  trying to drink lots of water -- but a little nausea no brb in urine but it is darker than usual   took 2 advil last night - did not help much   pain is gradually getting lower  no burning with urination or frequency   this will be the 4th kidney stone -- first one was in high school  saw urologist for 2nd one -- ? name    Allergies: 1)  * Duracef  Past History:  Past Medical History: Last updated: 07/19/2010 Allergic rhinitis Hypertension Hyperlipidemia Asthma  ? never rx with inhalers?   Past Surgical History: Last updated: 01/23/2007   L  arthroscopy-98 hydrocele removed 93-94'  Family History: Last updated: 08/22/2007 Family History Hypertension father had kidney stones, one sister in good health  Social History: Last updated: 07/19/2010 Occupation: Married  Alcohol use-no Drug use-no Regular exercise-no Former Smoker  Risk Factors: Exercise: no (07/19/2010)  Risk Factors: Smoking Status: quit (07/19/2010)  Review of Systems General:  Denies chills, fever, and loss of appetite. Eyes:  Denies eye irritation. CV:  Denies chest pain or discomfort, lightheadness, and palpitations. Resp:  Denies cough and shortness of breath. GI:  Denies abdominal pain, change in bowel habits, and indigestion. GU:  Denies dysuria, genital sores, hematuria, and urinary hesitancy. MS:  Complains of mid back pain. Derm:   Denies itching, lesion(s), poor wound healing, and rash. Neuro:  Denies numbness and tingling. Heme:  Denies abnormal bruising and bleeding.  Physical Exam  General:  obese and uncomfortable appearing - leaning on exam table and pacing the room Eyes:  vision grossly intact, pupils equal, pupils round, and pupils reactive to light.   Mouth:  pharynx pink and moist, no erythema, and no exudates.   Neck:  No deformities, masses, or tenderness noted. Lungs:  diffusely distant bs without rales or rhonchi or wheeze  Heart:  Normal rate and regular rhythm. S1 and S2 normal without gallop, murmur, click, rub or other extra sounds.no lifts.   Abdomen:  no suprapubic tenderness  L flank tenderness without rebound or gaurding  no M noted  soft, normal bowel sounds, no hepatomegaly, and no splenomegaly.   Msk:  L cva tenderness - marked  no bony tenderness  tender in soft tissues of L flank  Extremities:  no cce  Skin:  Intact without suspicious lesions or rashes Cervical Nodes:  No lymphadenopathy noted Psych:  normal affect, talkative and pleasant    Impression & Recommendations:  Problem # 1:  NEPHROLITHIASIS (ICD-592.0)  recurrent with L flank pain that is colicky  interestingly no blood on ua - but clinical picture is consistent with kidney stone (recurrent) will tx with anti nausea/ pain med and hydration  if not imp in 1-2 d will need further  eval / imaging  pt inst to go to ER if not improved or if unable to drink water (or other symptoms such as fever )  see px - vicodin and phenergan given also given 50 mg IM phenergan here (pt has a driver)  inst to inc fluid intake   Orders: UA Dipstick w/o Micro (manual) ([address]) Promethazine up to 50mg  (J2550) Admin of Therapeutic Inj  intramuscular or subcutaneous (60454)  Complete Medication List: 1)  Flonase 50 Mcg/act Susp (Fluticasone propionate) .... Spray 1 spray into both nostrils once  a day 2)  Zyrtec Allergy 10 Mg Tabs  (Cetirizine hcl) .... Take 1 tablet by mouth once a day; otc 3)  Cardura 8 Mg Tabs (Doxazosin mesylate) .Marland Kitchen.. 1 tab @ bedtime 4)  Zocor 40 Mg Tabs (Simvastatin) .Marland Kitchen.. 1 tab @ bedtime 5)  Aspirin 81 Mg Tbec (Aspirin) .Marland Kitchen.. 1 by mouth once daily 6)  Vicodin Es 7.5-750 Mg Tabs (Hydrocodone-acetaminophen) .Marland Kitchen.. 1 by mouth up to every 4- 6 hours as needed severe pain 7)  Promethazine Hcl 25 Mg Tabs (Promethazine hcl) .Marland Kitchen.. 1 by mouth up to three times a day as needed nausea  Patient Instructions: 1)  if pain becomes more severe - go to ER 2)  phenergan now  3)  take vicodin for pain with caution - will sedate  4)  also oral phenergan for nausea - also can sedate  5)  call Dr Marcos Eke for appt for re check  6)  drink fluids !  Prescriptions: PROMETHAZINE HCL 25 MG TABS (PROMETHAZINE HCL) 1 by mouth up to three times a day as needed nausea  #30 x 0   Entered and Authorized by:   Judith Part MD   Signed by:   Judith Part MD on 08/26/2010   Method used:   Print then Give to Patient   RxID:   [id number] VICODIN ES 7.5-750 MG TABS (HYDROCODONE-ACETAMINOPHEN) 1 by mouth up to every 4- 6 hours as needed severe pain  #30 x 0   Entered and Authorized by:   Judith Part MD   Signed by:   Judith Part MD on 08/26/2010   Method used:   Print then Give to Patient   RxID:   [id number]    Medication Administration  Injection # 1:    Medication: Promethazine up to 50mg     Diagnosis: NEPHROLITHIASIS (ICD-592.0)    Route: IM    Site: LUOQ gluteus    Exp Date: 07/23/2011    Lot #: [id number]    Mfr: Baxter    Comments: 50mg  per Dr. Milinda Antis    Patient tolerated injection without complications    Given by: Selena Batten Dance CMA Duncan Dull) (August 26, 2010 1:55 PM)  Orders Added: 1)  UA Dipstick w/o Micro (manual) [81002] 2)  Promethazine up to 50mg  [J2550] 3)  Admin of Therapeutic Inj  intramuscular or subcutaneous [96372] 4)  Est. Patient Level IV [01027]    Current Allergies  (reviewed today): * DURACEF  Laboratory Results   Urine Tests  Date/Time Received: August 26, 2010 1:26 PM  Date/Time Reported: August 26, 2010 1:26 PM   Routine Urinalysis   Color: amber Appearance: Clear Glucose: negative   (Normal Range: Negative) Bilirubin: negative   (Normal Range: Negative) Ketone: negative   (Normal Range: Negative) Spec. Gravity: 1.025   (Normal Range: 1.003-1.035) Blood: negative   (Normal Range: Negative) pH: 6.0   (Normal Range: 5.0-8.0) Protein: trace   (Normal  Range: Negative) Urobilinogen: 0.2   (Normal Range: 0-1) Nitrite: negative   (Normal Range: Negative) Leukocyte Esterace: negative   (Normal Range: Negative)

## 2010-09-07 ENCOUNTER — Other Ambulatory Visit: Payer: Self-pay | Admitting: *Deleted

## 2010-09-07 MED ORDER — FLUTICASONE PROPIONATE 50 MCG/ACT NA SUSP: 1.0000 | Freq: Every day | NASAL | Status: DC

## 2010-09-15 ENCOUNTER — Other Ambulatory Visit: Payer: BC Managed Care – PPO

## 2010-09-15 DIAGNOSIS — Z0000 Encounter for general adult medical examination without abnormal findings: Secondary | ICD-10-CM

## 2010-09-15 LAB — CBC WITH DIFFERENTIAL/PLATELET
Eosinophils Relative: 4.6 % (ref 0.0–5.0)
HCT: 46.6 % (ref 39.0–52.0)
Hemoglobin: 15.7 g/dL (ref 13.0–17.0)
Lymphs Abs: 2.4 10*3/uL (ref 0.7–4.0)
MCV: 91.5 fl (ref 78.0–100.0)
Monocytes Absolute: 0.3 10*3/uL (ref 0.1–1.0)
Neutro Abs: 2.5 10*3/uL (ref 1.4–7.7)
Platelets: 314 10*3/uL (ref 150.0–400.0)
WBC: 5.5 10*3/uL (ref 4.5–10.5)

## 2010-09-15 LAB — BASIC METABOLIC PANEL
BUN: 9 mg/dL (ref 6–23)
Chloride: 106 mEq/L (ref 96–112)
Glucose, Bld: 84 mg/dL (ref 70–99)
Potassium: 4.7 mEq/L (ref 3.5–5.1)

## 2010-09-15 LAB — HEPATIC FUNCTION PANEL
ALT: 44 U/L (ref 0–53)
Albumin: 4.2 g/dL (ref 3.5–5.2)
Total Bilirubin: 0.8 mg/dL (ref 0.3–1.2)
Total Protein: 6.6 g/dL (ref 6.0–8.3)

## 2010-09-15 LAB — LIPID PANEL
Cholesterol: 148 mg/dL (ref 0–200)
Triglycerides: 93 mg/dL (ref 0.0–149.0)

## 2010-09-15 LAB — TSH: TSH: 1.47 u[IU]/mL (ref 0.35–5.50)

## 2010-09-15 LAB — POCT URINALYSIS DIPSTICK
Bilirubin, UA: NEGATIVE
Blood, UA: NEGATIVE
Glucose, UA: NEGATIVE
Ketones, UA: NEGATIVE
Spec Grav, UA: 1.02

## 2010-09-22 ENCOUNTER — Ambulatory Visit (INDEPENDENT_AMBULATORY_CARE_PROVIDER_SITE_OTHER): Payer: BC Managed Care – PPO | Admitting: Family Medicine

## 2010-09-22 ENCOUNTER — Encounter: Payer: Self-pay | Admitting: Family Medicine

## 2010-09-22 DIAGNOSIS — J309 Allergic rhinitis, unspecified: Secondary | ICD-10-CM

## 2010-09-22 DIAGNOSIS — E785 Hyperlipidemia, unspecified: Secondary | ICD-10-CM

## 2010-09-22 DIAGNOSIS — I10 Essential (primary) hypertension: Secondary | ICD-10-CM

## 2010-09-22 MED ORDER — SIMVASTATIN 40 MG PO TABS: 40.0000 mg | ORAL_TABLET | Freq: Every day | ORAL | Status: DC

## 2010-09-22 MED ORDER — FLUTICASONE PROPIONATE 50 MCG/ACT NA SUSP: 1.0000 | Freq: Every day | NASAL | Status: DC

## 2010-09-22 MED ORDER — DOXAZOSIN MESYLATE 8 MG PO TABS: 8.0000 mg | ORAL_TABLET | Freq: Every day | ORAL | Status: DC

## 2010-09-22 NOTE — Patient Instructions (Signed)
Continue medications follow-up in one year

## 2010-09-22 NOTE — Progress Notes (Signed)
  Subjective:    Patient ID: Jim Goodwin, male    DOB: 1976-04-29, 35 y.o.   MRN: [id number]  HPI Jim Goodwin is a 35 year old, married male, nonsmoker, who comes in today for general physical examination because of a history of hypertension, hyperlipidemia, and allergic rhinitis.  His hypertension is treated with Cardura 8 mg nightly BP at home 120/80.  His allergic rhinitis history with Flonase nasal spray along with OTC Zyrtec.  His hyperlipidemia is treated with simvastatin 40 mg daily.  Lipids are at goal with an LDL of 93.  He continues to struggle with his weight is 273 pounds at 35 inches tall   Review of Systems  Constitutional: Negative.   HENT: Negative.   Eyes: Negative.   Respiratory: Negative.   Cardiovascular: Negative.   Gastrointestinal: Negative.   Genitourinary: Negative.   Musculoskeletal: Negative.   Skin: Negative.   Neurological: Negative.   Hematological: Negative.   Psychiatric/Behavioral: Negative.             Physical Exam  Constitutional: He is oriented to person, place, and time. He appears well-developed and well-nourished.  HENT:  Head: Normocephalic and atraumatic.  Right Ear: External ear normal.  Left Ear: External ear normal.  Nose: Nose normal.  Mouth/Throat: Oropharynx is clear and moist.  Eyes: Conjunctivae and EOM are normal. Pupils are equal, round, and reactive to light.  Neck: Normal range of motion. Neck supple. No JVD present. No tracheal deviation present. No thyromegaly present.  Cardiovascular: Normal rate, regular rhythm, normal heart sounds and intact distal pulses.  Exam reveals no gallop and no friction rub.   No murmur heard. Pulmonary/Chest: Effort normal and breath sounds normal. No stridor. No respiratory distress. He has no wheezes. He has no rales. He exhibits no tenderness.  Abdominal: Soft. Bowel sounds are normal. He exhibits no distension and no mass. There is no tenderness. There is no rebound and no  guarding.  Genitourinary: Rectum normal, prostate normal and penis normal. Guaiac negative stool. No penile tenderness.  Musculoskeletal: Normal range of motion. He exhibits no edema and no tenderness.  Lymphadenopathy:    He has no cervical adenopathy.  Neurological: He is alert and oriented to person, place, and time. He has normal reflexes. No cranial nerve deficit. He exhibits normal muscle tone.  Skin: Skin is warm and dry. No rash noted. No erythema. No pallor.  Psychiatric: He has a normal mood and affect. His behavior is normal. Judgment and thought content normal.          Assessment & Plan:  Hypertension continue Cardura 8 mg daily.  Allergic rhinitis.  Continue Flonase and OTC, Zyrtec.  Hyperlipidemia.  Continue Zocor 40 nightly

## 2011-05-21 ENCOUNTER — Ambulatory Visit (INDEPENDENT_AMBULATORY_CARE_PROVIDER_SITE_OTHER): Payer: BC Managed Care – PPO | Admitting: Family Medicine

## 2011-05-21 ENCOUNTER — Encounter: Payer: Self-pay | Admitting: Family Medicine

## 2011-05-21 VITALS — BP 140/98 | Temp 98.0°F | Wt 275.0 lb

## 2011-05-21 DIAGNOSIS — M549 Dorsalgia, unspecified: Secondary | ICD-10-CM

## 2011-05-21 MED ORDER — CYCLOBENZAPRINE HCL 5 MG PO TABS: 5.0000 mg | ORAL_TABLET | Freq: Three times a day (TID) | ORAL | Status: AC | PRN

## 2011-05-21 MED ORDER — TRAMADOL HCL 50 MG PO TABS: ORAL_TABLET | ORAL | Status: DC

## 2011-05-21 NOTE — Progress Notes (Signed)
  Subjective:    Patient ID: Jim Goodwin, male    DOB: June 23, 1976, 35 y.o.   MRN: [id number]  HPIRichard is a 35 year old, married male, nonsmoker, who comes in today for evaluation of right lumbar back pain x 4 months.  He states about 4 months ago he began having episodes of severe right lumbar back pain.  He describes the pain is sometimes dull, sometimes sharp.  A5 on a scale of one to 10.  Does not radiate.  It sometimes lasts for a few seconds sometimes lasts minutes or hours.  His weight is 275 pounds.  He has a sedentary job.  He sits all day and does not exercise on a regular basis.  No history of trauma    Review of Systems    General neurologic and musculoskeletal review of systems otherwise negative.  Specifically, no bowel nor bladder dysfunction Objective:   Physical Exam  Overweight male in no acute distress.  Examination of the spine shows it to be straight and no palpable tenderness.  There is no palpable tenderness in the back.  In the supine position.  His sensation, strength, and reflexes are normal.  Straight leg raising positive at 75 degrees, however, he has extremely tight hamstrings      Assessment & Plan:  Mechanical low back pain.  Plan Motrin, 600 b.i.d., Flexeril, and tramadol, nightly, p.r.n., and most importantly, a physical therapy consult for evaluation, treatment

## 2011-05-21 NOTE — Patient Instructions (Signed)
Take 600 mg of Motrin twice daily.  Flexeril and tramadol............ One half of each at bedtime as needed for severe pain.  We will get to set up for a physical therapy consult ASAP.  Return to see me p.r.n.

## 2011-07-24 DIAGNOSIS — J189 Pneumonia, unspecified organism: Secondary | ICD-10-CM

## 2011-07-24 HISTORY — DX: Pneumonia, unspecified organism: J18.9

## 2011-09-17 ENCOUNTER — Ambulatory Visit (INDEPENDENT_AMBULATORY_CARE_PROVIDER_SITE_OTHER): Payer: BC Managed Care – PPO | Admitting: Family Medicine

## 2011-09-17 ENCOUNTER — Encounter (HOSPITAL_COMMUNITY): Payer: Self-pay

## 2011-09-17 ENCOUNTER — Inpatient Hospital Stay (HOSPITAL_COMMUNITY)
Admission: EM | Admit: 2011-09-17 | Discharge: 2011-09-22 | DRG: 541 | Disposition: A | Discharge status: Home / Self Care | Payer: BC Managed Care – PPO | Attending: Internal Medicine | Admitting: Internal Medicine

## 2011-09-17 ENCOUNTER — Other Ambulatory Visit: Payer: BC Managed Care – PPO

## 2011-09-17 ENCOUNTER — Emergency Department (HOSPITAL_COMMUNITY): Payer: BC Managed Care – PPO

## 2011-09-17 ENCOUNTER — Encounter: Payer: Self-pay | Admitting: Family Medicine

## 2011-09-17 DIAGNOSIS — E871 Hypo-osmolality and hyponatremia: Secondary | ICD-10-CM | POA: Diagnosis present

## 2011-09-17 DIAGNOSIS — J189 Pneumonia, unspecified organism: Principal | ICD-10-CM | POA: Diagnosis present

## 2011-09-17 DIAGNOSIS — J309 Allergic rhinitis, unspecified: Secondary | ICD-10-CM

## 2011-09-17 DIAGNOSIS — Z7982 Long term (current) use of aspirin: Secondary | ICD-10-CM

## 2011-09-17 DIAGNOSIS — N179 Acute kidney failure, unspecified: Secondary | ICD-10-CM | POA: Diagnosis present

## 2011-09-17 DIAGNOSIS — R071 Chest pain on breathing: Secondary | ICD-10-CM

## 2011-09-17 DIAGNOSIS — M129 Arthropathy, unspecified: Secondary | ICD-10-CM | POA: Diagnosis present

## 2011-09-17 DIAGNOSIS — E785 Hyperlipidemia, unspecified: Secondary | ICD-10-CM

## 2011-09-17 DIAGNOSIS — I10 Essential (primary) hypertension: Secondary | ICD-10-CM

## 2011-09-17 DIAGNOSIS — R509 Fever, unspecified: Secondary | ICD-10-CM

## 2011-09-17 DIAGNOSIS — Z79899 Other long term (current) drug therapy: Secondary | ICD-10-CM

## 2011-09-17 DIAGNOSIS — M549 Dorsalgia, unspecified: Secondary | ICD-10-CM

## 2011-09-17 HISTORY — DX: Unspecified osteoarthritis, unspecified site: M19.90

## 2011-09-17 HISTORY — DX: Calculus of kidney: N20.0

## 2011-09-17 LAB — CBC
HCT: 43.2 % (ref 39.0–52.0)
Hemoglobin: 15 g/dL (ref 13.0–17.0)
MCH: 30.2 pg (ref 26.0–34.0)
MCV: 87.1 fL (ref 78.0–100.0)
RBC: 4.96 MIL/uL (ref 4.22–5.81)
WBC: 22.3 10*3/uL — ABNORMAL HIGH (ref 4.0–10.5)

## 2011-09-17 LAB — BASIC METABOLIC PANEL
BUN: 29 mg/dL — ABNORMAL HIGH (ref 6–23)
CO2: 25 mEq/L (ref 19–32)
Calcium: 9.3 mg/dL (ref 8.4–10.5)
Creatinine, Ser: 1.39 mg/dL — ABNORMAL HIGH (ref 0.50–1.35)
Glucose, Bld: 126 mg/dL — ABNORMAL HIGH (ref 70–99)
Sodium: 134 mEq/L — ABNORMAL LOW (ref 135–145)

## 2011-09-17 LAB — BLOOD GAS, ARTERIAL
Acid-base deficit: 2.2 mmol/L — ABNORMAL HIGH (ref 0.0–2.0)
Bicarbonate: 22.4 mEq/L (ref 20.0–24.0)
O2 Saturation: 95.5 %
Patient temperature: 98.6
TCO2: 19.7 mmol/L (ref 0–100)
pH, Arterial: 7.365 (ref 7.350–7.450)

## 2011-09-17 LAB — DIFFERENTIAL
Eosinophils Relative: 0 % (ref 0–5)
Lymphocytes Relative: 3 % — ABNORMAL LOW (ref 12–46)
Lymphs Abs: 0.8 10*3/uL (ref 0.7–4.0)
Monocytes Absolute: 1.3 10*3/uL — ABNORMAL HIGH (ref 0.1–1.0)
Monocytes Relative: 6 % (ref 3–12)

## 2011-09-17 MED ORDER — CYCLOBENZAPRINE HCL 5 MG PO TABS
5.0000 mg | ORAL_TABLET | Freq: Three times a day (TID) | ORAL | Status: DC | PRN
  Filled 2011-09-17: qty 1

## 2011-09-17 MED ORDER — MORPHINE SULFATE 2 MG/ML IJ SOLN
1.0000 mg | Freq: Once | INTRAMUSCULAR | Status: AC
  Administered 2011-09-17: 22:00:00 via INTRAVENOUS
  Filled 2011-09-17: qty 1

## 2011-09-17 MED ORDER — KETOROLAC TROMETHAMINE 30 MG/ML IJ SOLN
30.0000 mg | Freq: Once | INTRAMUSCULAR | Status: AC
  Administered 2011-09-17: 30 mg via INTRAVENOUS
  Filled 2011-09-17: qty 1

## 2011-09-17 MED ORDER — LEVALBUTEROL HCL 0.63 MG/3ML IN NEBU
0.6300 mg | INHALATION_SOLUTION | RESPIRATORY_TRACT | Status: DC | PRN
  Administered 2011-09-17: 0.63 mg via RESPIRATORY_TRACT
  Filled 2011-09-17: qty 3

## 2011-09-17 MED ORDER — MORPHINE SULFATE 4 MG/ML IJ SOLN
4.0000 mg | Freq: Once | INTRAMUSCULAR | Status: AC
  Administered 2011-09-17: 4 mg via INTRAVENOUS
  Filled 2011-09-17: qty 1

## 2011-09-17 MED ORDER — HYDROCODONE-ACETAMINOPHEN 5-325 MG PO TABS
1.0000 | ORAL_TABLET | ORAL | Status: DC | PRN
  Administered 2011-09-17 – 2011-09-22 (×12): 1 via ORAL
  Filled 2011-09-17 (×12): qty 1

## 2011-09-17 MED ORDER — OXYCODONE-ACETAMINOPHEN 5-325 MG PO TABS: 1.0000 | ORAL_TABLET | Freq: Three times a day (TID) | ORAL | Status: AC | PRN

## 2011-09-17 MED ORDER — HYDROMORPHONE HCL PF 1 MG/ML IJ SOLN
INTRAMUSCULAR | Status: AC
  Filled 2011-09-17: qty 1

## 2011-09-17 MED ORDER — SIMVASTATIN 40 MG PO TABS
40.0000 mg | ORAL_TABLET | Freq: Every day | ORAL | Status: DC
  Administered 2011-09-17 – 2011-09-21 (×5): 40 mg via ORAL
  Filled 2011-09-17 (×6): qty 1

## 2011-09-17 MED ORDER — DEXTROSE 5 % IV SOLN
500.0000 mg | Freq: Once | INTRAVENOUS | Status: AC
  Administered 2011-09-17: 500 mg via INTRAVENOUS
  Filled 2011-09-17: qty 500

## 2011-09-17 MED ORDER — MORPHINE SULFATE 4 MG/ML IJ SOLN
4.0000 mg | Freq: Once | INTRAMUSCULAR | Status: AC
  Administered 2011-09-17: 4 mg via INTRAVENOUS

## 2011-09-17 MED ORDER — SODIUM CHLORIDE 0.9 % IV BOLUS (SEPSIS): 1000.0000 mL | Freq: Once | INTRAVENOUS | Status: DC

## 2011-09-17 MED ORDER — ASPIRIN EC 81 MG PO TBEC
81.0000 mg | DELAYED_RELEASE_TABLET | Freq: Every day | ORAL | Status: DC
  Administered 2011-09-17 – 2011-09-22 (×6): 81 mg via ORAL
  Filled 2011-09-17 (×6): qty 1

## 2011-09-17 MED ORDER — HYDROMORPHONE HCL PF 1 MG/ML IJ SOLN
0.5000 mg | INTRAMUSCULAR | Status: DC | PRN
  Administered 2011-09-18: 0.5 mg via INTRAVENOUS
  Administered 2011-09-18: 17:00:00 via INTRAVENOUS
  Administered 2011-09-18 – 2011-09-20 (×6): 0.5 mg via INTRAVENOUS
  Administered 2011-09-20: 16:00:00 via INTRAVENOUS
  Administered 2011-09-21: 0.5 mg via INTRAVENOUS
  Administered 2011-09-21: 1 mg via INTRAVENOUS
  Administered 2011-09-21 (×2): 0.5 mg via INTRAVENOUS
  Filled 2011-09-17 (×14): qty 1

## 2011-09-17 MED ORDER — SODIUM CHLORIDE 0.9 % IV SOLN
INTRAVENOUS | Status: DC
  Administered 2011-09-17 – 2011-09-18 (×4): via INTRAVENOUS
  Administered 2011-09-19 (×2): 1000 mL via INTRAVENOUS
  Administered 2011-09-20 (×3): via INTRAVENOUS

## 2011-09-17 MED ORDER — SODIUM CHLORIDE 0.9 % IV BOLUS (SEPSIS)
1000.0000 mL | Freq: Once | INTRAVENOUS | Status: AC
  Administered 2011-09-17: 1000 mL via INTRAVENOUS

## 2011-09-17 MED ORDER — ONDANSETRON HCL 4 MG/2ML IJ SOLN: 4.0000 mg | Freq: Three times a day (TID) | INTRAMUSCULAR | Status: DC | PRN

## 2011-09-17 MED ORDER — MORPHINE SULFATE 25 MG/ML IV SOLN: 1.0000 mg/h | Freq: Once | INTRAVENOUS | Status: DC

## 2011-09-17 MED ORDER — KETOROLAC TROMETHAMINE 60 MG/2ML IM SOLN
60.0000 mg | Freq: Once | INTRAMUSCULAR | Status: AC
  Administered 2011-09-17: 60 mg via INTRAMUSCULAR

## 2011-09-17 MED ORDER — DOXYCYCLINE HYCLATE 100 MG PO TABS: 100.0000 mg | ORAL_TABLET | Freq: Once | ORAL | Status: DC

## 2011-09-17 MED ORDER — ACETAMINOPHEN 325 MG PO TABS
ORAL_TABLET | ORAL | Status: AC
  Administered 2011-09-17: 650 mg
  Filled 2011-09-17: qty 2

## 2011-09-17 MED ORDER — SODIUM CHLORIDE 0.9 % IV SOLN: INTRAVENOUS | Status: DC

## 2011-09-17 MED ORDER — LEVOFLOXACIN IN D5W 500 MG/100ML IV SOLN
500.0000 mg | INTRAVENOUS | Status: DC
  Administered 2011-09-17: 500 mg via INTRAVENOUS
  Filled 2011-09-17 (×2): qty 100

## 2011-09-17 MED ORDER — HYDROMORPHONE HCL PF 1 MG/ML IJ SOLN
0.5000 mg | INTRAMUSCULAR | Status: DC | PRN
  Administered 2011-09-17: 0.5 mg via INTRAVENOUS
  Filled 2011-09-17: qty 1

## 2011-09-17 MED ORDER — FLUTICASONE PROPIONATE 50 MCG/ACT NA SUSP
1.0000 | Freq: Every day | NASAL | Status: DC
  Administered 2011-09-18 – 2011-09-22 (×4): 1 via NASAL
  Filled 2011-09-17: qty 16

## 2011-09-17 MED ORDER — DEXTROSE 5 % IV SOLN
1.0000 g | Freq: Once | INTRAVENOUS | Status: AC
  Administered 2011-09-17: 1 g via INTRAVENOUS
  Filled 2011-09-17: qty 10

## 2011-09-17 NOTE — Progress Notes (Signed)
Patient is working really hard to breath. Respirations at 23 and shallow. MD notified.

## 2011-09-17 NOTE — Progress Notes (Signed)
  Subjective:    Patient ID: Jim Goodwin, male    DOB: [birth year], 36 y.o.   MRN: [id number]  HPI Jim Goodwin is a 36 year old married male nonsmoker who comes in today accompanied by his wife because of severe chest pain inability to take a deep breath and fever and chills for 2 days  He awoke on Saturday evening with fever chills and aching all over and slight cough. Yesterday he developed severe right upper chest pain. The pain on a scale of 1-10 is a 15.   Review of Systems General and pulmonary review of systems otherwise negative    Objective:   Physical Exam  Well-developed slightly overweight male in acute pain HEENT negative neck was supple no adenopathy unable to do a pulmonary exacerbation because he can't take a deep breath      Assessment & Plan:  Fever chills chest pain probable viral syndrome with pleurisy rule out pneumonia send for a chest x-ray stat

## 2011-09-17 NOTE — ED Notes (Signed)
   Report called nurse unavailable will call back

## 2011-09-17 NOTE — Patient Instructions (Signed)
Go directly to the main Ofc. for a chest x-ray  Then go home and I will call you the report

## 2011-09-17 NOTE — ED Notes (Addendum)
Worked  Around unit > 100 feet slight SOB Pox 91-95 hr 120-130 EDPA aware

## 2011-09-17 NOTE — H&P (Signed)
History and Physical  Jim Goodwin:[id number] DOB: [birth year] DOA: 09/17/2011  Referring physician: Cathren Laine, MD PCP: Evette Georges, MD, MD   Chief Complaint: Shortness of breath  HPI:  36 year old man presents the emergency department with three-day history of fever, cough, shortness of breath. Symptoms began 3 days ago he developed fever up to 102.9. He developed a worsening productive cough with blood-tinged sputum. Associated symptoms include severe pleuritic chest pain, chills, sweats and sore throat. The parents described as stabbing in quality. Positive sick contacts with his wife and children who had upper respiratory tract infections. Patient reports several episodes of diarrhea yesterday.  Patient was seen by his primary care physician today and referred to the emergency department for chest x-ray. Workup in the emergency department was notable for significant pneumonia as well as severe pain and tachycardia. She was referred to the emergency department for further evaluation and treatment.  Review of Systems:  Negative changes to his vision, rash, new muscle aches, bleeding, dysuria, abdominal pain. Otherwise as above. No previous history of pneumonia or pulmonary problems. He did smoke for approximately 15 years but quit 5 years ago.  Past Medical History  Diagnosis Date  . Allergy   . Hypertension   . Hyperlipidemia   . Arthritis    Past Surgical History  Procedure Date  . Knee arthroscopy     left  . Hydrocele excision     93-94   Social History:  reports that he quit smoking about 5 years ago. He does not have any smokeless tobacco history on file. He reports that he drinks about 1.2 ounces of alcohol per week. He reports that he does not use illicit drugs.  Allergies  Allergen Reactions  . Duracillin As (Penicillin G Procaine)     GI upset    Family History  Problem Relation Age of Onset  . Kidney disease Father   . Hypertension Other      Prior to Admission medications   Medication Sig Start Date End Date Taking? Authorizing Provider  aspirin 81 MG EC tablet Take 81 mg by mouth daily.     Yes Historical Provider, MD  cetirizine (ZYRTEC) 10 MG tablet Take 10 mg by mouth daily.     Yes Historical Provider, MD  cyclobenzaprine (FLEXERIL) 5 MG tablet Take 1 tablet (5 mg total) by mouth every 8 (eight) hours as needed for muscle spasms. 05/21/11 05/20/12 Yes Evette Georges, MD  doxazosin (CARDURA) 8 MG tablet Take 1 tablet (8 mg total) by mouth at bedtime. 09/22/10  Yes Evette Georges, MD  fluticasone [facility]) 50 MCG/ACT nasal spray Place 1 spray into the nose daily. As needed for additional allergy relief. 09/22/10  Yes Evette Georges, MD  simvastatin (ZOCOR) 40 MG tablet Take 1 tablet (40 mg total) by mouth at bedtime. 09/22/10  Yes Evette Georges, MD  traMADol (ULTRAM) 50 MG tablet One half to one tablet at bedtime as needed for pain/spasm. 05/21/11  Yes Evette Georges, MD  oxyCODONE-acetaminophen (ROXICET) 5-325 MG per tablet Take 1 tablet by mouth every 8 (eight) hours as needed for pain. 09/17/11 09/27/11  Evette Georges, MD   Physical Exam: Filed Vitals:   09/17/11 1025 09/17/11 1157  BP: 124/85 113/72  Pulse: 134 117  Temp: 100.4 F (38 C) 99.5 F (37.5 C)  TempSrc: Oral Oral  Resp: 22 15  SpO2: 95% 98%     General:  Appears moderately ill with tachypnea, dyspnea and severe  pain. He has however nontoxic after administration of morphine.  Eyes: Pupils equal, round, react to light. Normal lids, irises, and a  ENT: Grossly normal hearing. Normal lips and tongue.  Neck: No lymphadenopathy or masses. No thyromegaly appreciated  Cardiovascular: Tachycardic, regular rhythm. No murmur, rub, gallop.  Telemetry: Sinus tachycardia rate 130s to 140s.  Respiratory: Clear to auscultation on the left. Right with decreased breath sounds but no frank rhonchi, wheezes or rales. Shallow breathing secondary  to pain with respiratory rate in the 40s with pain. After demonstration of morphine respiratory rate in the high 20s.  Abdomen: Soft, nontender, nondistended.  Skin: Grossly unremarkable.  Musculoskeletal: Grossly unremarkable.  Psychiatric: Grossly normal mood and affect. Speech fluent and appropriate.  Labs on Admission:  Basic Metabolic Panel:  Lab 09/17/11 4540  NA 134*  K 4.1  CL 97  CO2 25  GLUCOSE 126*  BUN 29*  CREATININE 1.39*  CALCIUM 9.3  MG --  PHOS --   CBC:  Lab 09/17/11 1125  WBC 22.3*  NEUTROABS 20.2*  HGB 15.0  HCT 43.2  MCV 87.1  PLT 212   Radiological Exams on Admission: Dg Chest 2 View  09/17/2011  *RADIOLOGY REPORT*  Clinical Data: Fever, right chest pain, cough  CHEST - 2 VIEW  Comparison: 11/22/2006  Findings: Focal/patchy right mid lung opacity, possibly reflecting RUL pneumonia.  Additional patchy right basilar opacity with associated volume loss, atelectasis versus pneumonia. No pleural effusion or pneumothorax.  Cardiomediastinal silhouette is within normal limits.  Mild degenerative changes of the visualized thoracolumbar spine.  IMPRESSION: Focal/patchy right midlung opacity, possibly reflecting right upper lobe pneumonia.  Given focal appearance, follow-up chest radiographs are suggested to document resolution and exclude underlying mass.  Additional patchy right basilar opacity with associated volume loss, atelectasis versus pneumonia.  Original Report Authenticated By: Charline Bills, M.D.   Assessment/Plan 1. Community-acquired pneumonia: Associated symptoms include fever, tachycardia and tachypnea. Some diarrhea raises the question of Legionella. Empiric therapy with Levaquin. 2. Acute renal failure: IV fluids. Secondary to poor oral intake in the context of acute illness. 3. Hypertension: Appears stable at this point. 4. Hyperlipidemia: Stable.  At this point will admit to telemetry. Upon reevaluation the patient appears  improved.  Code Status: Full Family Communication: Discussed with his wife at bedside. All questions answered to her apparent satisfaction Disposition Plan: Home when improved.  Brendia Sacks, MD  [facility] Pager [phone] 09/17/2011, 2:36 PM

## 2011-09-17 NOTE — Progress Notes (Addendum)
Patient is having pain unrelieved by vicodin. MD notified. New order given.

## 2011-09-17 NOTE — ED Notes (Signed)
Patient transported to X-ray 

## 2011-09-17 NOTE — Progress Notes (Signed)
Pt asking about his Cardura and his heart rate is 125-127.  Text page sent to Dr Blake Divine

## 2011-09-17 NOTE — ED Notes (Signed)
Pt sent from Dr. Tawanna Cooler office d/t ?PNA, states was given a "pain shot"; pt c/o pain to rt shoulder/chest/ribs from coughing, also has a fever, blood in mucus, diarrhrea.

## 2011-09-17 NOTE — ED Provider Notes (Signed)
History     CSN: [id number]  Arrival date & time 09/17/11  1017   First MD Initiated Contact with Patient 09/17/11 1027      Chief Complaint  Patient presents with  . Cough    URI    (Consider location/radiation/quality/duration/timing/severity/associated sxs/prior treatment) Patient is a 37 y.o. male presenting with cough. The history is provided by the patient and the spouse.  Cough This is a new problem. The current episode started yesterday. The problem occurs constantly. The problem has been gradually worsening. The cough is productive of blood-tinged sputum. The maximum temperature recorded prior to his arrival was 102 to 102.9 F. The fever has been present for 1 to 2 days. Associated symptoms include chest pain, chills, sweats, sore throat and shortness of breath. Pertinent negatives include no weight loss, no headaches, no rhinorrhea, no myalgias, no wheezing and no eye redness. He has tried decongestants for the symptoms. The treatment provided no relief. Smoker: former smoker.   35yo WM with hx HTN, HLD presents with fever and cough. Fever and chills started on Saturday; Tmax has been 102. Has been taking APAP/ibu at home for this. The cough started yesterday and has been productive of blood tinged sputum. He has associated right sided stabbing chest pain with deep breathing and coughing. No rhinorrhea. Denies n/v/d. Wife and children have been ill with URI sx, low grade fever.  Past Medical History  Diagnosis Date  . Allergy   . Hypertension   . Hyperlipidemia   . Arthritis     Past Surgical History  Procedure Date  . Knee arthroscopy     left  . Hydrocele excision     93-94    Family History  Problem Relation Age of Onset  . Kidney disease Father   . Hypertension Other     History  Substance Use Topics  . Smoking status: Former Smoker    Quit date: 07/23/2006  . Smokeless tobacco: Not on file  . Alcohol Use: 1.2 oz/week    1 Cans of beer, 1 Shots of  liquor per week      Review of Systems  Constitutional: Positive for fever and chills. Negative for weight loss, diaphoresis, activity change, appetite change and fatigue.  HENT: Positive for sore throat. Negative for congestion, rhinorrhea, sneezing and trouble swallowing.   Eyes: Negative for redness.  Respiratory: Positive for cough and shortness of breath. Negative for chest tightness and wheezing.   Cardiovascular: Positive for chest pain. Negative for palpitations and leg swelling.  Gastrointestinal: Negative for nausea, vomiting, abdominal pain and diarrhea.  Genitourinary: Negative for dysuria.  Musculoskeletal: Negative for myalgias.  Skin: Negative for color change and rash.  Neurological: Negative for dizziness, weakness and headaches.    Allergies  Duracillin as  Home Medications   Current Outpatient Rx  Name Route Sig Dispense Refill  . ASPIRIN 81 MG PO TBEC Oral Take 81 mg by mouth daily.      Marland Kitchen CETIRIZINE HCL 10 MG PO TABS Oral Take 10 mg by mouth daily.      . CYCLOBENZAPRINE HCL 5 MG PO TABS Oral Take 1 tablet (5 mg total) by mouth every 8 (eight) hours as needed for muscle spasms. 30 tablet 1  . DOXAZOSIN MESYLATE 8 MG PO TABS Oral Take 1 tablet (8 mg total) by mouth at bedtime. 100 tablet 3  . FLUTICASONE PROPIONATE 50 MCG/ACT NA SUSP Nasal Place 1 spray into the nose daily. As needed for additional allergy relief.    Marland Kitchen  SIMVASTATIN 40 MG PO TABS Oral Take 1 tablet (40 mg total) by mouth at bedtime. 100 tablet 3  . TRAMADOL HCL 50 MG PO TABS  One half to one tablet at bedtime as needed for pain/spasm.    . OXYCODONE-ACETAMINOPHEN 5-325 MG PO TABS Oral Take 1 tablet by mouth every 8 (eight) hours as needed for pain. 20 tablet 0    BP 124/85  Pulse 134  Temp(Src) 100.4 F (38 C) (Oral)  Resp 22  SpO2 95%  Physical Exam  Nursing note and vitals reviewed. Constitutional: He is oriented to person, place, and time. He appears well-developed and well-nourished.  No distress.       Uncomfortable appearing, tripods on edge of bed  Nontoxic appearing, although appears sick  HENT:  Head: Normocephalic and atraumatic.  Right Ear: External ear normal.  Left Ear: External ear normal.  Nose: Nose normal.  Mouth/Throat: Oropharynx is clear and moist. No oropharyngeal exudate.       TMs nl bl Negative sinus tenderness  Eyes: EOM are normal. Pupils are equal, round, and reactive to light.  Neck: Normal range of motion. Neck supple. No rigidity.  Cardiovascular: Regular rhythm and normal heart sounds.  Tachycardia present.  Exam reveals no gallop and no friction rub.   No murmur heard.      Tachy to ~125  Pulmonary/Chest: Effort normal. No respiratory distress. He has no wheezes. He has no rales. He exhibits tenderness.       Tender to palp over R anterior chest  Decr breath sounds at b/l bases  Abdominal: Soft. Bowel sounds are normal. There is no tenderness. There is no rebound and no guarding.  Musculoskeletal: Normal range of motion.  Lymphadenopathy:    He has no cervical adenopathy.  Neurological: He is alert and oriented to person, place, and time.  Skin: Skin is warm and dry. No rash noted. He is not diaphoretic.  Psychiatric: He has a normal mood and affect.    ED Course  Procedures (including critical care time)  Labs Reviewed  CBC - Abnormal; Notable for the following:    WBC 22.3 (*)    All other components within normal limits  DIFFERENTIAL - Abnormal; Notable for the following:    Neutrophils Relative 91 (*)    Neutro Abs 20.2 (*)    Lymphocytes Relative 3 (*)    Monocytes Absolute 1.3 (*)    All other components within normal limits  BASIC METABOLIC PANEL - Abnormal; Notable for the following:    Sodium 134 (*)    Glucose, Bld 126 (*)    BUN 29 (*)    Creatinine, Ser 1.39 (*)    GFR calc non Af Amer 64 (*)    GFR calc Af Amer 75 (*)    All other components within normal limits   Dg Chest 2 View  09/17/2011  *RADIOLOGY  REPORT*  Clinical Data: Fever, right chest pain, cough  CHEST - 2 VIEW  Comparison: 11/22/2006  Findings: Focal/patchy right mid lung opacity, possibly reflecting RUL pneumonia.  Additional patchy right basilar opacity with associated volume loss, atelectasis versus pneumonia. No pleural effusion or pneumothorax.  Cardiomediastinal silhouette is within normal limits.  Mild degenerative changes of the visualized thoracolumbar spine.  IMPRESSION: Focal/patchy right midlung opacity, possibly reflecting right upper lobe pneumonia.  Given focal appearance, follow-up chest radiographs are suggested to document resolution and exclude underlying mass.  Additional patchy right basilar opacity with associated volume loss, atelectasis versus pneumonia.  Original Report Authenticated By: Charline Bills, M.D.     1. Community acquired pneumonia       MDM  Patient with 2 to three-day history of fever and cough. He appears sick, and has tachycardia to approximately 120 to 130. His chest x-ray shows a right midlung infiltrate, with bibasilar opacities. His lab work shows a leukocytosis with a white count of 22 and a left shift. Basic metabolic panel indicates renal insufficiency. He was given fluids and IV antibiotics here and ambulated. His heart rate elevated to 130 and he desaturated to about 92-93% on room air with ambulation. Given these factors, the pt's clinical appearance, and the appearance of the chest x-ray, feel that he requires admission.  2:48 PM D/W Dr. Irene Limbo with [facility]. Pt to be admitted to Team 5 for further treatment.    Grant Fontana, [place] 09/17/11 [phone]

## 2011-09-18 LAB — COMPREHENSIVE METABOLIC PANEL
Albumin: 3.2 g/dL — ABNORMAL LOW (ref 3.5–5.2)
Alkaline Phosphatase: 107 U/L (ref 39–117)
BUN: 34 mg/dL — ABNORMAL HIGH (ref 6–23)
Calcium: 8.5 mg/dL (ref 8.4–10.5)
Chloride: 99 mEq/L (ref 96–112)
Creatinine, Ser: 1.27 mg/dL (ref 0.50–1.35)
GFR calc Af Amer: 83 mL/min — ABNORMAL LOW (ref >90)

## 2011-09-18 LAB — LEGIONELLA ANTIGEN, URINE: Legionella Antigen, Urine: NEGATIVE

## 2011-09-18 LAB — EXPECTORATED SPUTUM ASSESSMENT W GRAM STAIN, RFLX TO RESP C

## 2011-09-18 LAB — CBC
HCT: 40.8 % (ref 39.0–52.0)
MCH: 30.2 pg (ref 26.0–34.0)
MCHC: 34.3 g/dL (ref 30.0–36.0)
RDW: 12.8 % (ref 11.5–15.5)

## 2011-09-18 LAB — DIFFERENTIAL
Basophils Absolute: 0 10*3/uL (ref 0.0–0.1)
Basophils Relative: 0 % (ref 0–1)
Eosinophils Absolute: 0.1 10*3/uL (ref 0.0–0.7)
Eosinophils Relative: 1 % (ref 0–5)
Monocytes Absolute: 1.2 10*3/uL — ABNORMAL HIGH (ref 0.1–1.0)
Monocytes Relative: 7 % (ref 3–12)
Neutro Abs: 15.3 10*3/uL — ABNORMAL HIGH (ref 1.7–7.7)

## 2011-09-18 MED ORDER — LEVOFLOXACIN IN D5W 750 MG/150ML IV SOLN
750.0000 mg | INTRAVENOUS | Status: DC
  Administered 2011-09-18 – 2011-09-20 (×3): 750 mg via INTRAVENOUS
  Filled 2011-09-18 (×4): qty 150

## 2011-09-18 MED ORDER — ENSURE CLINICAL ST REVIGOR PO LIQD
237.0000 mL | Freq: Three times a day (TID) | ORAL | Status: DC
  Administered 2011-09-18 – 2011-09-21 (×10): 237 mL via ORAL

## 2011-09-18 NOTE — ED Provider Notes (Signed)
Medical screening examination/treatment/procedure(s) were conducted as a shared visit with non-physician practitioner(s) and myself.  I personally evaluated the patient during the encounter Pt w cough, sharp cp, fever. pna on cxr. Will admit  Suzi Roots, MD 09/18/11 1426

## 2011-09-18 NOTE — Progress Notes (Addendum)
INITIAL ADULT NUTRITION ASSESSMENT Date: 09/18/2011   Time: 9:42 AM Reason for Assessment: Consult, Nutrition risk   ASSESSMENT: [name]  Dx: Shortness of breath  Hx:  Past Medical History  Diagnosis Date  . Allergy   . Hypertension   . Hyperlipidemia   . Arthritis   . Nephrolithiasis    Related Meds:  Scheduled Meds:   . acetaminophen      . aspirin EC  81 mg Oral Daily  . azithromycin  500 mg Intravenous Once  . cefTRIAXone (ROCEPHIN)  IV  1 g Intravenous Once  . fluticasone  1 spray Each Nare Daily  . ketorolac  30 mg Intravenous Once  . levofloxacin (LEVAQUIN) IV  750 mg Intravenous Q24H  .  morphine injection  1 mg Intravenous Once  .  morphine injection  4 mg Intravenous Once  .  morphine injection  4 mg Intravenous Once  . simvastatin  40 mg Oral QHS  . sodium chloride  1,000 mL Intravenous Once  . DISCONTD: sodium chloride   Intravenous STAT  . DISCONTD: doxycycline  100 mg Oral Once  . DISCONTD: levofloxacin (LEVAQUIN) IV  500 mg Intravenous Q24H  . DISCONTD: morphine  1 mg/hr Intravenous Once  . DISCONTD: sodium chloride  1,000 mL Intravenous Once   Continuous Infusions:   . sodium chloride 125 mL/hr at 09/18/11 0548   PRN Meds:.cyclobenzaprine, HYDROcodone-acetaminophen, HYDROmorphone (DILAUDID) injection, levalbuterol, DISCONTD:  HYDROmorphone (DILAUDID) injection, DISCONTD: ondansetron (ZOFRAN) IV  Ht: 5\' 9"  (175.3 cm)  Wt: 273 lb  (123.8kg)  Ideal Wt: 72.7kg % Ideal Wt:170  Usual Wt: 118.2-122.7kg % Usual Wt: 100-105  Body mass index is 40.29 kg/(m^2)  Class III obesity  Food/Nutrition Related Hx: Unable to talk with pt as pt not interested in talking r/t intense pain and pt's face grimacing, notified RN who was already aware and about to give pain medication. No family present. Per RN report, wife stated pt's appetite has been poor for the past week. Noted pt has sore throat and some difficulty swallowing r/t soreness. RN reports pt just  picked at his dinner yesterday and did not eat any breakfast this morning.    DG of chest on 2/25 showed: Focal/patchy right midlung opacity, possibly reflecting right upper  lobe pneumonia. Given focal appearance, follow-up chest  radiographs are suggested to document resolution and exclude  underlying mass.  Additional patchy right basilar opacity with associated volume  loss, atelectasis versus pneumonia.  Labs:  CMP     Component Value Date/Time   NA 132* 09/18/2011 0448   K 4.2 09/18/2011 0448   CL 99 09/18/2011 0448   CO2 23 09/18/2011 0448   GLUCOSE 136* 09/18/2011 0448   GLUCOSE 86 08/02/2006 1200   BUN 34* 09/18/2011 0448   CREATININE 1.27 09/18/2011 0448   CALCIUM 8.5 09/18/2011 0448   PROT 7.2 09/18/2011 0448   ALBUMIN 3.2* 09/18/2011 0448   AST 16 09/18/2011 0448   ALT 24 09/18/2011 0448   ALKPHOS 107 09/18/2011 0448   BILITOT 1.4* 09/18/2011 0448   GFRNONAA 72* 09/18/2011 0448   GFRAA 83* 09/18/2011 0448    Intake/Output Summary (Last 24 hours) at 09/18/11 1002 Last data filed at 09/18/11 0547  Gross per 24 hour  Intake 1400.98 ml  Output      0 ml  Net 1400.98 ml   Last BM - 09/16/11  Diet Order: General  IVF:    sodium chloride Last Rate: 125 mL/hr at 09/18/11 0548  Estimated Nutritional Needs:   Kcal: 1500-1850 Protein: 90-110g Fluid: 1.5-1.8  NUTRITION DIAGNOSIS: -Inadequate oral intake (NI-2.1).  Status: Ongoing  RELATED TO: PNA, poor appetite   AS EVIDENCE BY: <50% meal intake  MONITORING/EVALUATION(Goals): Pt to consume >75% of meals/supplements.   EDUCATION NEEDS: -Education not appropriate at this time - pt in pain, not interested in talking.   INTERVENTION: Ensure Clinical Strength TID. Nursing to continue to encourage increased intake. Will monitor. Recommend MD order chloraseptic spray to help with sore throat.   Dietitian #: [phone]  DOCUMENTATION CODES Per approved criteria  -Morbid Obesity    [name] 09/18/2011,  9:42 AM

## 2011-09-18 NOTE — Progress Notes (Signed)
Subjective: Patient feeling better this morning, breathing better. Have SOB on exertion. Relates chest pain right side.  Objective: Filed Vitals:   09/17/11 1820 09/17/11 2041 09/17/11 2130 09/18/11 0545  BP: 128/87  120/80 123/81  Pulse: 125  132 104  Temp: 99.6 F (37.6 C)  97.8 F (36.6 C) 98.5 F (36.9 C)  TempSrc: Oral  Oral Oral  Resp: 18  23 22   Height: 5\' 9"  (1.753 m)     Weight: 64.411 kg (142 lb)     SpO2: 97% 94% 95% 95%   Weight change:   Intake/Output Summary (Last 24 hours) at 09/18/11 0856 Last data filed at 09/18/11 0547  Gross per 24 hour  Intake 1400.98 ml  Output      0 ml  Net 1400.98 ml    General: Alert, awake, oriented x3, in no acute distress.  HEENT: No bruits, no goiter.  Heart: Regular rate and rhythm, without murmurs, rubs, gallops.  Lungs: Crackles right, bilateral air movement.  Abdomen: Soft, nontender, nondistended, positive bowel sounds.  Neuro: Grossly intact, nonfocal. Extremities; no edema.   Lab Results:  [facility] 09/18/11 0448 09/17/11 1125  NA 132* 134*  K 4.2 4.1  CL 99 97  CO2 23 25  GLUCOSE 136* 126*  BUN 34* 29*  CREATININE 1.27 1.39*  CALCIUM 8.5 9.3  MG -- --  PHOS -- --    Basename 09/18/11 0448  AST 16  ALT 24  ALKPHOS 107  BILITOT 1.4*  PROT 7.2  ALBUMIN 3.2*   No results found for this basename: LIPASE:2,AMYLASE:2 in the last 72 hours  Basename 09/18/11 0448 09/17/11 1125  WBC 17.6* 22.3*  NEUTROABS 15.3* 20.2*  HGB 14.0 15.0  HCT 40.8 43.2  MCV 87.9 87.1  PLT 215 212    Micro Results: No results found for this or any previous visit (from the past 240 hour(s)).  Studies/Results: Dg Chest 2 View  09/17/2011  *RADIOLOGY REPORT*  Clinical Data: Fever, right chest pain, cough  CHEST - 2 VIEW  Comparison: 11/22/2006  Findings: Focal/patchy right mid lung opacity, possibly reflecting RUL pneumonia.  Additional patchy right basilar opacity with associated volume loss, atelectasis versus pneumonia. No  pleural effusion or pneumothorax.  Cardiomediastinal silhouette is within normal limits.  Mild degenerative changes of the visualized thoracolumbar spine.  IMPRESSION: Focal/patchy right midlung opacity, possibly reflecting right upper lobe pneumonia.  Given focal appearance, follow-up chest radiographs are suggested to document resolution and exclude underlying mass.  Additional patchy right basilar opacity with associated volume loss, atelectasis versus pneumonia.  Original Report Authenticated By: Charline Bills, M.D.    Medications: I have reviewed the patient's current medications.  1-Community Acquired PNA: Continue with Levaquin day 1.  Continue with nebulizer treatments. Start incentive spirometry.  Sputum culture pending. HIV negative.  He will need follow up Chest X ray.   2-Leukocytosis: likely secondary to PNA. WBC trending down.   3-Acute renal failure: Likely prerenal. Continue with IV fluids.  4-Hyponatremia: Continue with IV fluids.  5-Tachycardia: likely secondary to acute illness. Hr decrease. 6-Hypertension; Hold BP medications in setting of infection.      LOS: 1 day   Jonae Renshaw M.D.  [facility] 09/18/2011, 8:56 AM

## 2011-09-19 LAB — BASIC METABOLIC PANEL
CO2: 28 mEq/L (ref 19–32)
Calcium: 8.8 mg/dL (ref 8.4–10.5)
Glucose, Bld: 122 mg/dL — ABNORMAL HIGH (ref 70–99)
Potassium: 3.6 mEq/L (ref 3.5–5.1)
Sodium: 135 mEq/L (ref 135–145)

## 2011-09-19 LAB — CBC
Hemoglobin: 12.6 g/dL — ABNORMAL LOW (ref 13.0–17.0)
MCH: 29.3 pg (ref 26.0–34.0)
Platelets: 265 10*3/uL (ref 150–400)
RBC: 4.3 MIL/uL (ref 4.22–5.81)
WBC: 11.5 10*3/uL — ABNORMAL HIGH (ref 4.0–10.5)

## 2011-09-19 MED ORDER — POLYETHYLENE GLYCOL 3350 17 G PO PACK
17.0000 g | PACK | Freq: Every day | ORAL | Status: DC
  Administered 2011-09-20 – 2011-09-21 (×2): 17 g via ORAL
  Filled 2011-09-19 (×4): qty 1

## 2011-09-19 NOTE — Progress Notes (Signed)
UR CHART REVIEWED; B Karman Veney RN, BSN, MHA 

## 2011-09-19 NOTE — Progress Notes (Signed)
Subjective: Patient feeling better, still with chest pain when he cough. Breathing better. He is using incentive spirometry.  Objective: Filed Vitals:   09/18/11 0948 09/18/11 1438 09/18/11 2200 09/19/11 0602  BP:  136/78 120/81 117/75  Pulse:  118 120 110  Temp:  99.2 F (37.3 C) 98.8 F (37.1 C) 99.2 F (37.3 C)  TempSrc:  Oral Oral Oral  Resp:  20 22 20   Height:      Weight: 123.832 kg (273 lb)     SpO2:  97% 96% 96%   Weight change:   Intake/Output Summary (Last 24 hours) at 09/19/11 1316 Last data filed at 09/19/11 0603  Gross per 24 hour  Intake 1691.66 ml  Output   1100 ml  Net 591.66 ml    General: Alert, awake, oriented x3, in no acute distress.  HEENT: No bruits, no goiter.  Heart: Regular rate and rhythm, without murmurs, rubs, gallops.  Lungs: Crackles bl,, bilateral air movement.  Abdomen: Soft, nontender, nondistended, positive bowel sounds.  Neuro: Grossly intact, nonfocal. Extremities; no edema.   Lab Results:  [facility] 09/18/11 0448 09/17/11 1125  NA 132* 134*  K 4.2 4.1  CL 99 97  CO2 23 25  GLUCOSE 136* 126*  BUN 34* 29*  CREATININE 1.27 1.39*  CALCIUM 8.5 9.3  MG -- --  PHOS -- --    Basename 09/18/11 0448  AST 16  ALT 24  ALKPHOS 107  BILITOT 1.4*  PROT 7.2  ALBUMIN 3.2*    Basename 09/18/11 0448 09/17/11 1125  WBC 17.6* 22.3*  NEUTROABS 15.3* 20.2*  HGB 14.0 15.0  HCT 40.8 43.2  MCV 87.9 87.1  PLT 215 212   Micro Results: Recent Results (from the past 240 hour(s))  CULTURE, SPUTUM-ASSESSMENT     Status: Normal   Collection Time   09/18/11 10:37 AM      Component Value Range Status Comment   Specimen Description SPUTUM   Final    Special Requests NONE   Final    Sputum evaluation     Final    Value: THIS SPECIMEN IS ACCEPTABLE. RESPIRATORY CULTURE REPORT TO FOLLOW.   Report Status 09/18/2011 FINAL   Final   CULTURE, RESPIRATORY     Status: Normal (Preliminary result)   Collection Time   09/18/11 10:37 AM   Component Value Range Status Comment   Specimen Description SPUTUM   Final    Special Requests NONE   Final    Gram Stain     Final    Value: FEW WBC PRESENT,BOTH PMN AND MONONUCLEAR     RARE SQUAMOUS EPITHELIAL CELLS PRESENT     NO ORGANISMS SEEN   Culture Culture reincubated for better growth   Final    Report Status PENDING   Incomplete     Studies/Results: No results found.  Medications: I have reviewed the patient's current medications.  1-Community Acquired PNA:  Continue with Levaquin day 2.  Continue with nebulizer treatments.  incentive spirometry.  Sputum culture pending.  HIV negative. He will need follow up Chest X ray.  2-Leukocytosis: likely secondary to PNA.  WBC trending down.  3-Acute renal failure: Likely prerenal. Continue with IV fluids.  4-Hyponatremia: Continue with IV fluids.  5-Tachycardia: likely secondary to acute illness. Hr decrease.  6-Hypertension; Hold BP medications in setting of infection.        LOS: 2 days   Niasha Devins M.D.  [facility] 09/19/2011, 1:16 PM

## 2011-09-20 ENCOUNTER — Inpatient Hospital Stay (HOSPITAL_COMMUNITY): Payer: BC Managed Care – PPO

## 2011-09-20 LAB — CBC
HCT: 36.9 % — ABNORMAL LOW (ref 39.0–52.0)
Hemoglobin: 12.5 g/dL — ABNORMAL LOW (ref 13.0–17.0)
MCV: 87.2 fL (ref 78.0–100.0)
RBC: 4.23 MIL/uL (ref 4.22–5.81)
RDW: 12.8 % (ref 11.5–15.5)
WBC: 12.5 10*3/uL — ABNORMAL HIGH (ref 4.0–10.5)

## 2011-09-20 LAB — BASIC METABOLIC PANEL
BUN: 12 mg/dL (ref 6–23)
CO2: 29 mEq/L (ref 19–32)
Chloride: 100 mEq/L (ref 96–112)
Creatinine, Ser: 0.91 mg/dL (ref 0.50–1.35)
GFR calc Af Amer: >90 mL/min (ref >90)
Glucose, Bld: 101 mg/dL — ABNORMAL HIGH (ref 70–99)
Potassium: 3.6 mEq/L (ref 3.5–5.1)

## 2011-09-20 LAB — CULTURE, RESPIRATORY W GRAM STAIN: Culture: NORMAL

## 2011-09-20 MED ORDER — DOXAZOSIN MESYLATE 8 MG PO TABS
8.0000 mg | ORAL_TABLET | Freq: Every day | ORAL | Status: DC
  Administered 2011-09-20 – 2011-09-21 (×2): 8 mg via ORAL
  Filled 2011-09-20 (×3): qty 1

## 2011-09-20 NOTE — Progress Notes (Signed)
Subjective: Feeling a little better, able to walk without significant SOB. Pain improved.  Objective: Filed Vitals:   09/19/11 1405 09/19/11 2048 09/20/11 0550 09/20/11 1328  BP: 128/78 136/71 146/83 154/73  Pulse: 121 116 100 109  Temp: 97.9 F (36.6 C) 100.8 F (38.2 C) 97.9 F (36.6 C) 98.8 F (37.1 C)  TempSrc: Oral Oral Oral Oral  Resp: 20 20 24 20   Height:      Weight:      SpO2: 93% 98% 97% 96%   Weight change:   Intake/Output Summary (Last 24 hours) at 09/20/11 1450 Last data filed at 09/20/11 1322  Gross per 24 hour  Intake 5966.25 ml  Output    350 ml  Net 5616.25 ml    General: Alert, awake, oriented x3, in no acute distress.  HEENT: No bruits, no goiter.  Heart: Regular rate and rhythm, without murmurs, rubs, gallops.  Lungs: Crackles bl, bilateral air movement.  Abdomen: Soft, nontender, nondistended, positive bowel sounds.  Neuro: Grossly intact, nonfocal. Extremities; no edema.   Lab Results:  [facility] 09/20/11 0520 09/19/11 1355  NA 136 135  K 3.6 3.6  CL 100 101  CO2 29 28  GLUCOSE 101* 122*  BUN 12 15  CREATININE 0.91 0.89  CALCIUM 8.7 8.8  MG -- --  PHOS -- --    Basename 09/18/11 0448  AST 16  ALT 24  ALKPHOS 107  BILITOT 1.4*  PROT 7.2  ALBUMIN 3.2*    Basename 09/20/11 0520 09/19/11 1355 09/18/11 0448  WBC 12.5* 11.5* --  NEUTROABS -- -- 15.3*  HGB 12.5* 12.6* --  HCT 36.9* 37.7* --  MCV 87.2 87.7 --  PLT 255 265 --   Micro Results: Recent Results (from the past 240 hour(s))  CULTURE, SPUTUM-ASSESSMENT     Status: Normal   Collection Time   09/18/11 10:37 AM      Component Value Range Status Comment   Specimen Description SPUTUM   Final    Special Requests NONE   Final    Sputum evaluation     Final    Value: THIS SPECIMEN IS ACCEPTABLE. RESPIRATORY CULTURE REPORT TO FOLLOW.   Report Status 09/18/2011 FINAL   Final   CULTURE, RESPIRATORY     Status: Normal   Collection Time   09/18/11 10:37 AM      Component Value  Range Status Comment   Specimen Description SPUTUM   Final    Special Requests NONE   Final    Gram Stain     Final    Value: FEW WBC PRESENT,BOTH PMN AND MONONUCLEAR     RARE SQUAMOUS EPITHELIAL CELLS PRESENT     NO ORGANISMS SEEN   Culture NORMAL OROPHARYNGEAL FLORA   Final    Report Status 09/20/2011 FINAL   Final     Studies/Results: Dg Chest 2 View  09/20/2011  *RADIOLOGY REPORT*  Clinical Data: Pneumonia, weakness, fever.  CHEST - 2 VIEW  Comparison: 09/17/2011  Findings: Right lower lobe airspace opacity again noted with small right effusion, not significantly changed.  Left base atelectasis. Heart is borderline in size.  Low lung volumes.  No real change since prior study.  IMPRESSION: No significant change.  Original Report Authenticated By: Cyndie Chime, M.D.    Medications: I have reviewed the patient's current medications.  1-Community Acquired PNA:  Continue with Levaquin day 3.  Continue with nebulizer treatments.  incentive spirometry.  Sputum culture pending.  HIV negative. Chest X ray stable.  Still with mild fever. 2-Leukocytosis: likely secondary to PNA.  WBC trending down.  3-Acute renal failure: Likely prerenal. Resolved.  4-Hyponatremia: resolved. 5-Tachycardia: likely secondary to acute illness. Hr decrease.  6-Hypertension; restart home BP medication.          LOS: 3 days   Jim Goodwin M.D.  [facility] 09/20/2011, 2:50 PM

## 2011-09-21 LAB — BASIC METABOLIC PANEL
BUN: 10 mg/dL (ref 6–23)
Chloride: 99 mEq/L (ref 96–112)
Creatinine, Ser: 0.81 mg/dL (ref 0.50–1.35)
GFR calc non Af Amer: >90 mL/min (ref >90)
Glucose, Bld: 102 mg/dL — ABNORMAL HIGH (ref 70–99)
Potassium: 3.2 mEq/L — ABNORMAL LOW (ref 3.5–5.1)

## 2011-09-21 LAB — CBC
HCT: 35.5 % — ABNORMAL LOW (ref 39.0–52.0)
Hemoglobin: 12.1 g/dL — ABNORMAL LOW (ref 13.0–17.0)
MCHC: 34.1 g/dL (ref 30.0–36.0)
MCV: 87.2 fL (ref 78.0–100.0)

## 2011-09-21 MED ORDER — POTASSIUM CHLORIDE CRYS ER 20 MEQ PO TBCR
40.0000 meq | EXTENDED_RELEASE_TABLET | Freq: Once | ORAL | Status: AC
  Administered 2011-09-21: 40 meq via ORAL
  Filled 2011-09-21: qty 2

## 2011-09-21 MED ORDER — LEVOFLOXACIN 750 MG PO TABS
750.0000 mg | ORAL_TABLET | Freq: Every day | ORAL | Status: DC
  Administered 2011-09-21 – 2011-09-22 (×2): 750 mg via ORAL
  Filled 2011-09-21 (×2): qty 1

## 2011-09-21 NOTE — Progress Notes (Signed)
Pt's O2 while sitting was 91% without O2.  NT walked with patient without oxygen while monitoring O2 sats.  Pt's O2 dropped to as low as 78% and pulse increased to 114.  Total distance was to nurse's station and back to room.

## 2011-09-21 NOTE — Progress Notes (Signed)
Subjective: Patient feeling better. SOB improved.  Objective: Filed Vitals:   09/20/11 1328 09/20/11 2230 09/21/11 0447 09/21/11 1414  BP: 154/73 128/80 149/95 128/77  Pulse: 109 105 95 103  Temp: 98.8 F (37.1 C) 98.4 F (36.9 C) 97.6 F (36.4 C) 98.2 F (36.8 C)  TempSrc: Oral Oral Oral Oral  Resp: 20 20 20 20   Height:      Weight:      SpO2: 96% 96% 96% 94%   Weight change:   Intake/Output Summary (Last 24 hours) at 09/21/11 1451 Last data filed at 09/21/11 1415  Gross per 24 hour  Intake 2230.42 ml  Output      0 ml  Net 2230.42 ml    General: Alert, awake, oriented x3, in no acute distress.  HEENT: No bruits, no goiter.  Heart: Regular rate and rhythm, without murmurs, rubs, gallops.  Lungs: Crakle bl,  bilateral air movement.  Abdomen: Soft, nontender, nondistended, positive bowel sounds.  Neuro: Grossly intact, nonfocal. Extremities; no edema.   Lab Results:  [facility] 09/21/11 0505 09/20/11 0520  NA 136 136  K 3.2* 3.6  CL 99 100  CO2 29 29  GLUCOSE 102* 101*  BUN 10 12  CREATININE 0.81 0.91  CALCIUM 8.8 8.7  MG -- --  PHOS -- --    Basename 09/21/11 0505 09/20/11 0520  WBC 11.4* 12.5*  NEUTROABS -- --  HGB 12.1* 12.5*  HCT 35.5* 36.9*  MCV 87.2 87.2  PLT 267 255    Micro Results: Recent Results (from the past 240 hour(s))  CULTURE, SPUTUM-ASSESSMENT     Status: Normal   Collection Time   09/18/11 10:37 AM      Component Value Range Status Comment   Specimen Description SPUTUM   Final    Special Requests NONE   Final    Sputum evaluation     Final    Value: THIS SPECIMEN IS ACCEPTABLE. RESPIRATORY CULTURE REPORT TO FOLLOW.   Report Status 09/18/2011 FINAL   Final   CULTURE, RESPIRATORY     Status: Normal   Collection Time   09/18/11 10:37 AM      Component Value Range Status Comment   Specimen Description SPUTUM   Final    Special Requests NONE   Final    Gram Stain     Final    Value: FEW WBC PRESENT,BOTH PMN AND MONONUCLEAR   RARE SQUAMOUS EPITHELIAL CELLS PRESENT     NO ORGANISMS SEEN   Culture NORMAL OROPHARYNGEAL FLORA   Final    Report Status 09/20/2011 FINAL   Final     Studies/Results: Dg Chest 2 View  09/20/2011  *RADIOLOGY REPORT*  Clinical Data: Pneumonia, weakness, fever.  CHEST - 2 VIEW  Comparison: 09/17/2011  Findings: Right lower lobe airspace opacity again noted with small right effusion, not significantly changed.  Left base atelectasis. Heart is borderline in size.  Low lung volumes.  No real change since prior study.  IMPRESSION: No significant change.  Original Report Authenticated By: Cyndie Chime, M.D.    Medications: I have reviewed the patient's current medications. 1-Community Acquired PNA:  Continue with Levaquin day 4, change to PO.  Continue with nebulizer treatments.  incentive spirometry.  Sputum culture pending.  HIV negative. Chest X ray stable.  Afebrile. Will observed 24 hour on oral antibiotics. Likely discharge 3-2.  2-Leukocytosis: likely secondary to PNA.  WBC trending down.  3-Acute renal failure: Likely prerenal. Resolved.  4-Hyponatremia: resolved.  5-Tachycardia: likely secondary  to acute illness. Hr decrease.  6-Hypertension; restart home BP medication.       LOS: 4 days   Samariya Rockhold M.D.  [facility] 09/21/2011, 2:51 PM

## 2011-09-22 LAB — BASIC METABOLIC PANEL
BUN: 9 mg/dL (ref 6–23)
CO2: 30 mEq/L (ref 19–32)
Calcium: 8.9 mg/dL (ref 8.4–10.5)
Chloride: 99 mEq/L (ref 96–112)
Creatinine, Ser: 0.9 mg/dL (ref 0.50–1.35)

## 2011-09-22 MED ORDER — LEVOFLOXACIN 750 MG PO TABS: 750.0000 mg | ORAL_TABLET | Freq: Every day | ORAL | Status: AC

## 2011-09-22 NOTE — Discharge Summary (Signed)
Admit date: 09/17/2011 Discharge date: 09/22/2011  Primary Care Physician:  Evette Georges, MD, MD   Discharge Diagnoses:   Community-acquired pneumonia Acute renal failure: Hypertension Hyperlipidemia Hyponatremia.    DISCHARGE MEDICATION: Medication List  As of 09/22/2011 11:11 AM   STOP taking these medications         traMADol 50 MG tablet         TAKE these medications         aspirin 81 MG EC tablet   Take 81 mg by mouth daily.      cetirizine 10 MG tablet   Commonly known as: ZYRTEC   Take 10 mg by mouth daily.      cyclobenzaprine 5 MG tablet   Commonly known as: FLEXERIL   Take 1 tablet (5 mg total) by mouth every 8 (eight) hours as needed for muscle spasms.      doxazosin 8 MG tablet   Commonly known as: CARDURA   Take 1 tablet (8 mg total) by mouth at bedtime.      fluticasone 50 MCG/ACT nasal spray   Commonly known as: FLONASE   Place 1 spray into the nose daily. As needed for additional allergy relief.      levofloxacin 750 MG tablet   Commonly known as: LEVAQUIN   Take 1 tablet (750 mg total) by mouth daily.      oxyCODONE-acetaminophen 5-325 MG per tablet   Commonly known as: PERCOCET   Take 1 tablet by mouth every 8 (eight) hours as needed for pain.      simvastatin 40 MG tablet   Commonly known as: ZOCOR   Take 1 tablet (40 mg total) by mouth at bedtime.              Consults:  None.   SIGNIFICANT DIAGNOSTIC STUDIES:  Dg Chest 2 View  09/20/2011  *RADIOLOGY REPORT*  Clinical Data: Pneumonia, weakness, fever.  CHEST - 2 VIEW  Comparison: 09/17/2011  Findings: Right lower lobe airspace opacity again noted with small right effusion, not significantly changed.  Left base atelectasis. Heart is borderline in size.  Low lung volumes.  No real change since prior study.  IMPRESSION: No significant change.  Original Report Authenticated By: Cyndie Chime, M.D.   Dg Chest 2 View  09/17/2011  *RADIOLOGY REPORT*  Clinical Data: Fever, right  chest pain, cough  CHEST - 2 VIEW  Comparison: 11/22/2006  Findings: Focal/patchy right mid lung opacity, possibly reflecting RUL pneumonia.  Additional patchy right basilar opacity with associated volume loss, atelectasis versus pneumonia. No pleural effusion or pneumothorax.  Cardiomediastinal silhouette is within normal limits.  Mild degenerative changes of the visualized thoracolumbar spine.  IMPRESSION: Focal/patchy right midlung opacity, possibly reflecting right upper lobe pneumonia.  Given focal appearance, follow-up chest radiographs are suggested to document resolution and exclude underlying mass.  Additional patchy right basilar opacity with associated volume loss, atelectasis versus pneumonia.  Original Report Authenticated By: Charline Bills, M.D.     Recent Results (from the past 240 hour(s))  CULTURE, SPUTUM-ASSESSMENT     Status: Normal   Collection Time   09/18/11 10:37 AM      Component Value Range Status Comment   Specimen Description SPUTUM   Final    Special Requests NONE   Final    Sputum evaluation     Final    Value: THIS SPECIMEN IS ACCEPTABLE. RESPIRATORY CULTURE REPORT TO FOLLOW.   Report Status 09/18/2011 FINAL   Final   CULTURE, RESPIRATORY  Status: Normal   Collection Time   09/18/11 10:37 AM      Component Value Range Status Comment   Specimen Description SPUTUM   Final    Special Requests NONE   Final    Gram Stain     Final    Value: FEW WBC PRESENT,BOTH PMN AND MONONUCLEAR     RARE SQUAMOUS EPITHELIAL CELLS PRESENT     NO ORGANISMS SEEN   Culture NORMAL OROPHARYNGEAL FLORA   Final    Report Status 09/20/2011 FINAL   Final     BRIEF ADMITTING H & P: 36 year old man presents the emergency department with three-day history of fever, cough, shortness of breath. Symptoms began 3 days ago he developed fever up to 102.9. He developed a worsening productive cough with blood-tinged sputum. Associated symptoms include severe pleuritic chest pain, chills, sweats  and sore throat. The parents described as stabbing in quality. Positive sick contacts with his wife and children who had upper respiratory tract infections. Patient reports several episodes of diarrhea yesterday.  Patient was seen by his primary care physician today and referred to the emergency department for chest x-ray. Workup in the emergency department was notable for significant pneumonia as well as severe pain and tachycardia. he was referred to the emergency department for further evaluation and treatment.  Hospital Course:  1-Community Acquired PNA:  Patient was admitted for IV antibiotics. He was started on IV Levaquin. He received 3 days of IV antibiotics. WBC on admission at 22, day prior discharge at 11.  He was observed on oral antibiotics. Patient will need 6 more days of antibiotics for total of 10 days. Chest x ray was repeated and was stable. Patient will need follow up x ray to document resolution due to extensive infiltrates.  Sputum culture pending. HIV negative. Chest X ray stable. Patient remain afebrile, improved clinically. He oxygen decrease on ambulation. He will need home oxygen until he recover from PNA.   2-Leukocytosis: likely secondary to PNA.  WBC decreased.  3-Acute renal failure: Likely prerenal. Resolved with IV fluids. Cr peak at  1.27. Date discharge Cr at 0.9 4-Hyponatremia: Secondary to dehydration. Resolved with IV fluids.  5-Tachycardia: likely secondary to acute illness. Hr decrease. Improved.  6-Hypertension; Patient BP medications initially on hold. His medication was restarted over course of hospitalization.   The day of discharge patient was in improved condition. He was advised to follow up with his PCP. Patient aware that he needs follow up chest x ray.   Disposition and Follow-up:  Discharge Orders    Future Orders Please Complete By Expires   Diet general      Increase activity slowly        Follow-up Information    Follow up with  TODD,JEFFREY ALLEN, MD in 5 days.          DISCHARGE EXAM:  General: Alert, awake, oriented x3, in no acute distress.  HEENT: No bruits, no goiter.  Heart: Regular rate and rhythm, without murmurs, rubs, gallops.  Lungs: Crakle bl, bilateral air movement.  Abdomen: Soft, nontender, nondistended, positive bowel sounds.  Neuro: Grossly intact, nonfocal.  Extremities; no edema.    Blood pressure 146/81, pulse 97, temperature 98 F (36.7 C), temperature source Oral, resp. rate 19, height 5\' 9"  (1.753 m), weight 123.832 kg (273 lb), SpO2 92.00%.   Basename 09/22/11 0555 09/21/11 0505  NA 136 136  K 3.6 3.2*  CL 99 99  CO2 30 29  GLUCOSE 104* 102*  BUN 9 10  CREATININE 0.90 0.81  CALCIUM 8.9 8.8  MG -- --  PHOS -- --    Basename 09/21/11 0505 09/20/11 0520  WBC 11.4* 12.5*  NEUTROABS -- --  HGB 12.1* 12.5*  HCT 35.5* 36.9*  MCV 87.2 87.2  PLT 267 255    Signed: Kirandeep Fariss M.D. 09/22/2011, 11:11 AM

## 2011-09-22 NOTE — Progress Notes (Signed)
After home 02 set-up delivered by home care personnel, and its use demonstrated by them; pt. Is taken to his car per w/c without incident.  He signs and verbalizes understanding of d/c instructions.  Rx for Levaquin also sent with pt.  His spouse is driving.

## 2011-09-22 NOTE — Progress Notes (Signed)
Cm received MD order for pt home oxygen. Jim Goodwin contacted @ [phone] concerning delivery of DME. DME scheduled delivery to room prior to pt discharge. H&p, demographics, and Md orders faxed to [facility] @ [phone].  Jim Goodwin [phone]

## 2011-09-24 ENCOUNTER — Encounter: Payer: BC Managed Care – PPO | Admitting: Family Medicine

## 2011-09-27 ENCOUNTER — Ambulatory Visit (INDEPENDENT_AMBULATORY_CARE_PROVIDER_SITE_OTHER): Payer: BC Managed Care – PPO | Admitting: Family Medicine

## 2011-09-27 ENCOUNTER — Encounter: Payer: Self-pay | Admitting: Family Medicine

## 2011-09-27 DIAGNOSIS — J189 Pneumonia, unspecified organism: Secondary | ICD-10-CM

## 2011-09-27 NOTE — Patient Instructions (Signed)
Return when necessary 

## 2011-09-27 NOTE — Progress Notes (Signed)
  Subjective:    Patient ID: Jim Goodwin, male    DOB: 1976-01-03, 36 y.o.   MRN: [id number]  HPI Emrick is a 36 year old married male nonsmoker who comes in today for followup of pneumonia  We saw him here about 10 days ago with fever chills severe chest pain shortness of breath and referred him to the hospital for admission. He was hospitalized for 5 days on antibiotics. Blood cultures showed no growth however his white count at admission was 22,000 with left shift and on chest x-ray had an infiltrate so most likely a bacterial pneumonia. He's now doing well and has one more dose of Levaquin before he finishes his outpatient oral meds. He states he feels fine no chest pain. He was sent home on oxygen but does not feel like he needs it anymore   Review of Systems General and pulmonary review of systems otherwise negative    Objective:   Physical Exam Well-developed well-nourished male in no acute distress HEENT negative neck was supple no adenopathy lungs are clear       Assessment & Plan:  Status post bacterial pneumonia return when necessary

## 2011-09-28 ENCOUNTER — Telehealth: Payer: Self-pay | Admitting: Family Medicine

## 2011-09-28 NOTE — Telephone Encounter (Signed)
Pt needs order fax [phone]) for [facility] to pick up oxygen tanks etc.

## 2011-10-01 NOTE — Telephone Encounter (Signed)
Okay 

## 2011-10-02 NOTE — Telephone Encounter (Signed)
Order faxed.

## 2011-10-22 ENCOUNTER — Other Ambulatory Visit: Payer: Self-pay | Admitting: Family Medicine

## 2011-12-10 ENCOUNTER — Other Ambulatory Visit (INDEPENDENT_AMBULATORY_CARE_PROVIDER_SITE_OTHER): Payer: BC Managed Care – PPO

## 2011-12-10 DIAGNOSIS — Z0000 Encounter for general adult medical examination without abnormal findings: Secondary | ICD-10-CM

## 2011-12-10 LAB — POCT URINALYSIS DIPSTICK
Bilirubin, UA: NEGATIVE
Glucose, UA: NEGATIVE
Ketones, UA: NEGATIVE
Spec Grav, UA: >=1.03
Urobilinogen, UA: 0.2

## 2011-12-10 LAB — LIPID PANEL
Cholesterol: 133 mg/dL (ref 0–200)
LDL Cholesterol: 75 mg/dL (ref 0–99)
Triglycerides: 93 mg/dL (ref 0.0–149.0)

## 2011-12-10 LAB — CBC WITH DIFFERENTIAL/PLATELET
Basophils Absolute: 0 10*3/uL (ref 0.0–0.1)
Eosinophils Absolute: 0.2 10*3/uL (ref 0.0–0.7)
Lymphocytes Relative: 45 % (ref 12.0–46.0)
MCHC: 33 g/dL (ref 30.0–36.0)
Neutrophils Relative %: 42.1 % — ABNORMAL LOW (ref 43.0–77.0)
Platelets: 285 10*3/uL (ref 150.0–400.0)
RDW: 14.4 % (ref 11.5–14.6)

## 2011-12-10 LAB — HEPATIC FUNCTION PANEL
Albumin: 4.1 g/dL (ref 3.5–5.2)
Alkaline Phosphatase: 35 U/L — ABNORMAL LOW (ref 39–117)
Bilirubin, Direct: 0.1 mg/dL (ref 0.0–0.3)
Total Bilirubin: 0.7 mg/dL (ref 0.3–1.2)

## 2011-12-10 LAB — BASIC METABOLIC PANEL
CO2: 28 mEq/L (ref 19–32)
Calcium: 9 mg/dL (ref 8.4–10.5)
Creatinine, Ser: 0.9 mg/dL (ref 0.4–1.5)
GFR: 104.36 mL/min (ref >60.00)
Glucose, Bld: 83 mg/dL (ref 70–99)

## 2011-12-25 ENCOUNTER — Encounter: Payer: Self-pay | Admitting: Family Medicine

## 2011-12-25 ENCOUNTER — Ambulatory Visit (INDEPENDENT_AMBULATORY_CARE_PROVIDER_SITE_OTHER): Payer: BC Managed Care – PPO | Admitting: Family Medicine

## 2011-12-25 VITALS — BP 140/90 | Temp 98.4°F | Ht 71.0 in | Wt 263.0 lb

## 2011-12-25 DIAGNOSIS — Z0000 Encounter for general adult medical examination without abnormal findings: Secondary | ICD-10-CM

## 2011-12-25 DIAGNOSIS — J309 Allergic rhinitis, unspecified: Secondary | ICD-10-CM

## 2011-12-25 DIAGNOSIS — I10 Essential (primary) hypertension: Secondary | ICD-10-CM

## 2011-12-25 DIAGNOSIS — E785 Hyperlipidemia, unspecified: Secondary | ICD-10-CM

## 2011-12-25 MED ORDER — FLUTICASONE PROPIONATE 50 MCG/ACT NA SUSP: 1.0000 | Freq: Every day | NASAL | Status: DC

## 2011-12-25 MED ORDER — DOXAZOSIN MESYLATE 8 MG PO TABS: 8.0000 mg | ORAL_TABLET | Freq: Every day | ORAL | Status: DC

## 2011-12-25 MED ORDER — SIMVASTATIN 40 MG PO TABS: 40.0000 mg | ORAL_TABLET | Freq: Every day | ORAL | Status: DC

## 2011-12-25 NOTE — Progress Notes (Signed)
  Subjective:    Patient ID: Jim Goodwin, male    DOB: 05/06/1976, 36 y.o.   MRN: [id number]  HPI Jim Goodwin is a 36 year old married male nonsmoker who comes in today for general physical examination  He takes Zyrtec 10 mg each bedtime for allergic rhinitis  He takes simvastatin 40 mg daily for hyperlipidemia aspirin tablet  He takes Cardura 8 mg each bedtime for hypertension he also uses Flonase and Flexeril when necessary  Tetanus booster 2007  EKG March 2013 normal at that time he was admitted for a pneumonia   Review of Systems  Constitutional: Negative.   HENT: Negative.   Eyes: Negative.   Respiratory: Negative.   Cardiovascular: Negative.   Gastrointestinal: Negative.   Genitourinary: Negative.   Musculoskeletal: Negative.   Skin: Negative.   Neurological: Negative.   Hematological: Negative.   Psychiatric/Behavioral: Negative.        Objective:   Physical Exam  Constitutional: He is oriented to person, place, and time. He appears well-developed and well-nourished.  HENT:  Head: Normocephalic and atraumatic.  Right Ear: External ear normal.  Left Ear: External ear normal.  Nose: Nose normal.  Mouth/Throat: Oropharynx is clear and moist.  Eyes: Conjunctivae and EOM are normal. Pupils are equal, round, and reactive to light.  Neck: Normal range of motion. Neck supple. No JVD present. No tracheal deviation present. No thyromegaly present.  Cardiovascular: Normal rate, regular rhythm, normal heart sounds and intact distal pulses.  Exam reveals no gallop and no friction rub.   No murmur heard. Pulmonary/Chest: Effort normal and breath sounds normal. No stridor. No respiratory distress. He has no wheezes. He has no rales. He exhibits no tenderness.  Abdominal: Soft. Bowel sounds are normal. He exhibits no distension and no mass. There is no tenderness. There is no rebound and no guarding.  Genitourinary: Penis normal. No penile tenderness.  Musculoskeletal: Normal  range of motion. He exhibits no edema and no tenderness.  Lymphadenopathy:    He has no cervical adenopathy.  Neurological: He is alert and oriented to person, place, and time. He has normal reflexes. No cranial nerve deficit. He exhibits normal muscle tone.  Skin: Skin is warm and dry. No rash noted. No erythema. No pallor.  Psychiatric: He has a normal mood and affect. His behavior is normal. Judgment and thought content normal.          Assessment & Plan:  Healthy male  Allergic rhinitis continue Zyrtec and steroid nasal spray  Hypertension continue Cardura 8 mg daily  Hyperlipidemia continue Zocor 40 an aspirin tablet  Occasional back pain Flexeril when necessary Motrin

## 2011-12-25 NOTE — Patient Instructions (Signed)
Continue your current medications  Followup in 1 year sooner if any problems 

## 2012-07-24 ENCOUNTER — Other Ambulatory Visit: Payer: Self-pay | Admitting: Family Medicine

## 2012-10-22 ENCOUNTER — Other Ambulatory Visit: Payer: Self-pay | Admitting: Family Medicine

## 2012-10-25 ENCOUNTER — Other Ambulatory Visit: Payer: Self-pay | Admitting: Family Medicine

## 2012-12-23 ENCOUNTER — Other Ambulatory Visit (INDEPENDENT_AMBULATORY_CARE_PROVIDER_SITE_OTHER): Payer: BC Managed Care – PPO

## 2012-12-23 DIAGNOSIS — Z0000 Encounter for general adult medical examination without abnormal findings: Secondary | ICD-10-CM

## 2012-12-23 LAB — LIPID PANEL
Cholesterol: 128 mg/dL (ref 0–200)
HDL: 30.7 mg/dL — ABNORMAL LOW (ref >39.00)
LDL Cholesterol: 80 mg/dL (ref 0–99)
Total CHOL/HDL Ratio: 4
Triglycerides: 88 mg/dL (ref 0.0–149.0)
VLDL: 17.6 mg/dL (ref 0.0–40.0)

## 2012-12-23 LAB — CBC WITH DIFFERENTIAL/PLATELET
Basophils Absolute: 0 10*3/uL (ref 0.0–0.1)
Eosinophils Absolute: 0.2 10*3/uL (ref 0.0–0.7)
MCHC: 33.7 g/dL (ref 30.0–36.0)
MCV: 90.7 fl (ref 78.0–100.0)
Monocytes Absolute: 0.4 10*3/uL (ref 0.1–1.0)
Neutrophils Relative %: 51.3 % (ref 43.0–77.0)
Platelets: 275 10*3/uL (ref 150.0–400.0)
WBC: 5.6 10*3/uL (ref 4.5–10.5)

## 2012-12-23 LAB — BASIC METABOLIC PANEL
BUN: 12 mg/dL (ref 6–23)
Chloride: 107 mEq/L (ref 96–112)
Creatinine, Ser: 0.9 mg/dL (ref 0.4–1.5)

## 2012-12-23 LAB — POCT URINALYSIS DIPSTICK
Bilirubin, UA: NEGATIVE
Blood, UA: NEGATIVE
Glucose, UA: NEGATIVE
Ketones, UA: NEGATIVE
Spec Grav, UA: 1.025

## 2012-12-23 LAB — HEPATIC FUNCTION PANEL
Bilirubin, Direct: 0 mg/dL (ref 0.0–0.3)
Total Bilirubin: 0.6 mg/dL (ref 0.3–1.2)

## 2012-12-26 ENCOUNTER — Other Ambulatory Visit: Payer: Self-pay | Admitting: Family Medicine

## 2012-12-30 ENCOUNTER — Encounter: Payer: Self-pay | Admitting: Family Medicine

## 2012-12-30 ENCOUNTER — Ambulatory Visit (INDEPENDENT_AMBULATORY_CARE_PROVIDER_SITE_OTHER): Payer: BC Managed Care – PPO | Admitting: Family Medicine

## 2012-12-30 VITALS — BP 148/98 | Temp 98.4°F | Ht 70.5 in | Wt 276.0 lb

## 2012-12-30 DIAGNOSIS — I10 Essential (primary) hypertension: Secondary | ICD-10-CM

## 2012-12-30 DIAGNOSIS — J309 Allergic rhinitis, unspecified: Secondary | ICD-10-CM

## 2012-12-30 DIAGNOSIS — E785 Hyperlipidemia, unspecified: Secondary | ICD-10-CM

## 2012-12-30 DIAGNOSIS — Z0000 Encounter for general adult medical examination without abnormal findings: Secondary | ICD-10-CM

## 2012-12-30 DIAGNOSIS — R030 Elevated blood-pressure reading, without diagnosis of hypertension: Secondary | ICD-10-CM

## 2012-12-30 MED ORDER — DOXAZOSIN MESYLATE 8 MG PO TABS: 8.0000 mg | ORAL_TABLET | Freq: Every day | ORAL | Status: DC

## 2012-12-30 MED ORDER — SIMVASTATIN 40 MG PO TABS: 40.0000 mg | ORAL_TABLET | Freq: Every day | ORAL | Status: DC

## 2012-12-30 NOTE — Patient Instructions (Signed)
Continue the Cardura 8 mg daily,,,,,,,, check your blood pressure daily in the morning for 3 weeks,,,,,,,,, BP goal 135/85 or less,,,,,,, if not at goal return in 3 weeks with the data and the device  For overall good health avoid salt and walk 20 minutes daily  Continue the Zyrtec and steroid nasal spray for allergic rhinitis  Continue the 40 mg of Zocor daily with an aspirin tablet

## 2012-12-30 NOTE — Progress Notes (Signed)
  Subjective:    Patient ID: Jim Goodwin, male    DOB: Nov 04, 1975, 37 y.o.   MRN: [id number]  HPI Jim Goodwin is a 37 year old male nonsmoker who comes in today for general physical examination because of a history of mild hypertension on Cardura 8 mg daily, allergic rhinitis on steroid nasal spray and Zyrtec each bedtime, hyperlipidemia simvastatin 40 mg daily  He wears glasses and gets routine eye care, regular dental care, tetanus 2007  BP today 140/98. He hasn't checked his blood pressure at home and while   Review of Systems  Constitutional: Negative.   HENT: Negative.   Eyes: Negative.   Respiratory: Negative.   Cardiovascular: Negative.   Gastrointestinal: Negative.   Genitourinary: Negative.   Musculoskeletal: Negative.   Skin: Negative.   Neurological: Negative.   Psychiatric/Behavioral: Negative.        Objective:   Physical Exam  Constitutional: He is oriented to person, place, and time. He appears well-developed and well-nourished.  HENT:  Head: Normocephalic and atraumatic.  Right Ear: External ear normal.  Left Ear: External ear normal.  Nose: Nose normal.  Mouth/Throat: Oropharynx is clear and moist.  Eyes: Conjunctivae and EOM are normal. Pupils are equal, round, and reactive to light.  Neck: Normal range of motion. Neck supple. No JVD present. No tracheal deviation present. No thyromegaly present.  Cardiovascular: Normal rate, regular rhythm, normal heart sounds and intact distal pulses.  Exam reveals no gallop and no friction rub.   No murmur heard. Pulmonary/Chest: Effort normal and breath sounds normal. No stridor. No respiratory distress. He has no wheezes. He has no rales. He exhibits no tenderness.  Abdominal: Soft. Bowel sounds are normal. He exhibits no distension and no mass. There is no tenderness. There is no rebound and no guarding.  Genitourinary: Penis normal. No penile tenderness.  Musculoskeletal: Normal range of motion. He exhibits no edema  and no tenderness.  Lymphadenopathy:    He has no cervical adenopathy.  Neurological: He is alert and oriented to person, place, and time. He has normal reflexes. No cranial nerve deficit. He exhibits normal muscle tone.  Skin: Skin is warm and dry. No rash noted. No erythema. No pallor.  Total body skin exam normal the area on his back that he was concerned about appears to be some spider bites noninfected  Psychiatric: He has a normal mood and affect. His behavior is normal. Judgment and thought content normal.          Assessment & Plan:  Healthy male  Allergic rhinitis continue Zyrtec and steroid nasal spray  History of mild hypertension continue Cardura 8 mg each bedtime check BP daily for 3 weeks to be sure blood pressures at goal 135/85 or less if not call  Hyperlipidemia continue Zocor 1 daily along with an aspirin tablet

## 2013-02-03 ENCOUNTER — Ambulatory Visit (INDEPENDENT_AMBULATORY_CARE_PROVIDER_SITE_OTHER): Payer: BC Managed Care – PPO | Admitting: Family Medicine

## 2013-02-03 ENCOUNTER — Encounter: Payer: Self-pay | Admitting: Family Medicine

## 2013-02-03 VITALS — BP 130/90 | Temp 98.0°F | Wt 274.0 lb

## 2013-02-03 DIAGNOSIS — R1013 Epigastric pain: Secondary | ICD-10-CM

## 2013-02-03 DIAGNOSIS — I10 Essential (primary) hypertension: Secondary | ICD-10-CM

## 2013-02-03 NOTE — Progress Notes (Signed)
  Subjective:    Patient ID: Jim Goodwin, male    DOB: 11-04-1975, 37 y.o.   MRN: [id number]  HPIRichard is a 37 year old married male nonsmoker who comes in today for evaluation of 2 problems  He has underlying hypertension on Cardura 8 mg. His BP is averaging 125/86 at home. BP today 130/90  He's had 3 episodes in the last 6 weeks of severe nausea sometimes chills with gurgling midepigastric abdominal pain. All 3 episodes of lasted from 5-7 days. They do not seem to be triggered by any particular food. In the most instances he was not eating or had not eaten before the symptoms started. He says it starts as a gurgling and he points to the midepigastric right upper quadrant area pain. Then he gets nauseated has some fever chills occasional vomiting no diarrhea. He's having episodes now. This episode started last Friday.  Family history negative for gallbladder disease except a grandmother.    Review of Systems    Gen. GI review of systems otherwise negative no urinary tract symptoms no diarrhea change of bowel habits etc. Objective:   Physical Exam Well-developed well-nourished male no acute distress examination the abdomen was soft the bowel sounds are normal ,,,,,,,,,,,,, liver spleen and kidneys not enlarged no palpable tenderness or masses.       Assessment & Plan:  Hypertension at goal continue current therapy  3 episodes of epigastric abdominal pain with nausea fever chills question gallbladder disease plan fat free diet begin workup

## 2013-02-03 NOTE — Patient Instructions (Signed)
Continue current medications  Stay on a complete fat free diet  We will get you set up for an ultrasound of her gallbladder ASAP

## 2013-02-10 ENCOUNTER — Ambulatory Visit
Admission: RE | Admit: 2013-02-10 | Discharge: 2013-02-10 | Disposition: A | Discharge status: Home / Self Care | Payer: BC Managed Care – PPO | Source: Ambulatory Visit | Attending: Family Medicine | Admitting: Family Medicine

## 2013-02-10 DIAGNOSIS — R1013 Epigastric pain: Secondary | ICD-10-CM

## 2013-02-11 ENCOUNTER — Telehealth: Payer: Self-pay | Admitting: *Deleted

## 2013-02-11 DIAGNOSIS — R9389 Abnormal findings on diagnostic imaging of other specified body structures: Secondary | ICD-10-CM

## 2013-02-11 NOTE — Telephone Encounter (Signed)
Referral sent 

## 2013-02-13 ENCOUNTER — Telehealth: Payer: Self-pay | Admitting: Family Medicine

## 2013-02-13 NOTE — Telephone Encounter (Signed)
Pt received paperwork for referral appt ,8/6, Dr Lowella Dell, pt would like to know what to expect...surgery?Marland Kitchen..check up.pt would like for you to call him

## 2013-02-16 NOTE — Telephone Encounter (Signed)
Left detailed message on machine for patient 

## 2013-02-19 ENCOUNTER — Ambulatory Visit (INDEPENDENT_AMBULATORY_CARE_PROVIDER_SITE_OTHER): Payer: BC Managed Care – PPO | Admitting: Internal Medicine

## 2013-02-19 ENCOUNTER — Encounter: Payer: Self-pay | Admitting: Internal Medicine

## 2013-02-19 VITALS — BP 160/100 | HR 106 | Temp 98.4°F | Resp 20 | Wt 271.0 lb

## 2013-02-19 DIAGNOSIS — I10 Essential (primary) hypertension: Secondary | ICD-10-CM

## 2013-02-19 DIAGNOSIS — R1013 Epigastric pain: Secondary | ICD-10-CM

## 2013-02-19 MED ORDER — PROMETHAZINE HCL 25 MG PO TABS: 25.0000 mg | ORAL_TABLET | Freq: Three times a day (TID) | ORAL | Status: DC | PRN

## 2013-02-19 MED ORDER — HYOSCYAMINE SULFATE 0.125 MG SL SUBL: 0.1250 mg | SUBLINGUAL_TABLET | SUBLINGUAL | Status: DC | PRN

## 2013-02-19 NOTE — Progress Notes (Signed)
Subjective:    Patient ID: Jim Goodwin, male    DOB: 1975/12/05, 37 y.o.   MRN: [id number]  HPI  37 year old patient who is seen today for followup of recurrent nausea and diarrhea. Symptoms initially started in late April. While at work he experienced an episode of nausea associated with vomiting and diarrhea. Symptoms gradually improved over one week. Symptoms seem a maximal in the morning but tended to improve throughout the day. He has had 4 similar episodes of shorter duration. He also describes some fullness and gaseousness in the right upper quadrant area. Yesterday his fourth episode occurred following lunch. He denies any prior history of similar symptoms or history of IBS. Evaluation has included a gallbladder ultrasound that revealed no gallstones and no evidence of cholecystitis. He is scheduled for general surgery evaluation. Symptoms do not seem related to food intake. He has a very modest caffeine load  Past Medical History  Diagnosis Date  . Allergy   . Hypertension   . Hyperlipidemia   . Arthritis   . Nephrolithiasis     History   Social History  . Marital Status: Married    Spouse Name: N/A    Number of Children: N/A  . Years of Education: N/A   Occupational History  . Not on file.   Social History Main Topics  . Smoking status: Former Smoker    Quit date: 07/23/2006  . Smokeless tobacco: Former Neurosurgeon  . Alcohol Use: 1.2 oz/week    1 Cans of beer, 1 Shots of liquor per week  . Drug Use: No  . Sexually Active: Yes -- Male, Male partner(s)   Other Topics Concern  . Not on file   Social History Narrative  . No narrative on file    Past Surgical History  Procedure Laterality Date  . Knee arthroscopy      left  . Hydrocele excision      93-94    Family History  Problem Relation Age of Onset  . Kidney disease Father   . Hypertension Other     Allergies  Allergen Reactions  . Duracillin As (Penicillin G Procaine)     GI upset    Current  Outpatient Prescriptions on File Prior to Visit  Medication Sig Dispense Refill  . aspirin 81 MG EC tablet Take 81 mg by mouth daily.        . cetirizine (ZYRTEC) 10 MG tablet Take 10 mg by mouth daily.        Marland Kitchen doxazosin (CARDURA) 8 MG tablet Take 1 tablet (8 mg total) by mouth at bedtime.  100 tablet  3  . fluticasone (FLONASE) 50 MCG/ACT nasal spray Place 1 spray into the nose daily. As needed for additional allergy relief.  16 g  11  . simvastatin (ZOCOR) 40 MG tablet Take 1 tablet (40 mg total) by mouth at bedtime.  100 tablet  3   No current facility-administered medications on file prior to visit.    BP 160/100  Pulse 106  Temp(Src) 98.4 F (36.9 C) (Oral)  Resp 20  Wt 271 lb (122.925 kg)  BMI 38.32 kg/m2  SpO2 98%       Review of Systems  Constitutional: Positive for diaphoresis. Negative for fever, chills, appetite change and fatigue.  HENT: Negative for hearing loss, ear pain, congestion, sore throat, trouble swallowing, neck stiffness, dental problem, voice change and tinnitus.   Eyes: Negative for pain, discharge and visual disturbance.  Respiratory: Negative for cough, chest tightness,  wheezing and stridor.   Cardiovascular: Negative for chest pain, palpitations and leg swelling.  Gastrointestinal: Positive for nausea and diarrhea. Negative for vomiting, abdominal pain, constipation, blood in stool and abdominal distention.  Genitourinary: Negative for urgency, hematuria, flank pain, discharge, difficulty urinating and genital sores.  Musculoskeletal: Negative for myalgias, back pain, joint swelling, arthralgias and gait problem.  Skin: Negative for rash.  Neurological: Positive for weakness. Negative for dizziness, syncope, speech difficulty, numbness and headaches.  Hematological: Negative for adenopathy. Does not bruise/bleed easily.  Psychiatric/Behavioral: Negative for behavioral problems and dysphoric mood. The patient is not nervous/anxious.         Objective:   Physical Exam  Constitutional: He is oriented to person, place, and time. He appears well-developed.  HENT:  Head: Normocephalic.  Right Ear: External ear normal.  Left Ear: External ear normal.  Eyes: Conjunctivae and EOM are normal.  Neck: Normal range of motion.  Cardiovascular: Normal rate and normal heart sounds.   Pulmonary/Chest: Breath sounds normal.  Abdominal: Soft. Bowel sounds are normal. He exhibits no distension. There is no tenderness. There is no rebound and no guarding.  Musculoskeletal: Normal range of motion. He exhibits no edema and no tenderness.  Neurological: He is alert and oriented to person, place, and time.  Psychiatric: He has a normal mood and affect. His behavior is normal.          Assessment & Plan:   Recurrent episodes of nausea associated with diarrhea. Normal gallbladder ultrasound.   Symptom complex seems most consistent with IBS although no prior history. Will place on high fiber diet and treat with anti-spasmodic and anti-emetics.. May need GI referral Hypertension. Blood pressure up a bit today. No change in therapy. Continue home blood pressure monitoring

## 2013-02-19 NOTE — Patient Instructions (Addendum)
High fiber diet  Use nausea medication as directed  Take probiotics daily as prescribed  Consider GI followup/referral if unimproved  Limit your sodium (Salt) intake  Please check your blood pressure on a regular basis.  If it is consistently greater than 150/90, please make an office appointment. Diet and Irritable Bowel Syndrome  No cure has been found for irritable bowel syndrome (IBS). Many options are available to treat the symptoms. Your caregiver will give you the best treatments available for your symptoms. He or she will also encourage you to manage stress and to make changes to your diet. You need to work with your caregiver and Registered Dietician to find the best combination of medicine, diet, counseling, and support to control your symptoms. The following are some diet suggestions. FOODS THAT MAKE IBS WORSE  Fatty foods, such as [place] fries.  Milk products, such as cheese or ice cream.  Chocolate.  Alcohol.  Caffeine (found in coffee and some sodas).  Carbonated drinks, such as soda. If certain foods cause symptoms, you should eat less of them or stop eating them. FOOD JOURNAL   Keep a journal of the foods that seem to cause distress. Write down:  What you are eating during the day and when.  What problems you are having after eating.  When the symptoms occur in relation to your meals.  What foods always make you feel badly.  Take your notes with you to your caregiver to see if you should stop eating certain foods. FOODS THAT MAKE IBS BETTER Fiber reduces IBS symptoms, especially constipation, because it makes stools soft, bulky, and easier to pass. Fiber is found in bran, bread, cereal, beans, fruit, and vegetables. Examples of foods with fiber include:  Apples.  Peaches.  Pears.  Berries.  Figs.  Broccoli, raw.  Cabbage.  Carrots.  Raw peas.  Kidney beans.  Lima beans.  Whole-grain bread.  Whole-grain cereal. Add foods with fiber to  your diet a little at a time. This will let your body get used to them. Too much fiber at once might cause gas and swelling of your abdomen. This can trigger symptoms in a person with IBS. Caregivers usually recommend a diet with enough fiber to produce soft, painless bowel movements. High fiber diets may cause gas and bloating. However, these symptoms often go away within a few weeks, as your body adjusts. In many cases, dietary fiber may lessen IBS symptoms, particularly constipation. However, it may not help pain or diarrhea. High fiber diets keep the colon mildly enlarged (distended) with the added fiber. This may help prevent spasms in the colon. Some forms of fiber also keep water in the stool, thereby preventing hard stools that are difficult to pass.  Besides telling you to eat more foods with fiber, your caregiver may also tell you to get more fiber by taking a fiber pill or drinking water mixed with a special high fiber powder. An example of this is a natural fiber laxative containing psyllium seed.  TIPS  Large meals can cause cramping and diarrhea in people with IBS. If this happens to you, try eating 4 or 5 small meals a day, or try eating less at each of your usual 3 meals. It may also help if your meals are low in fat and high in carbohydrates. Examples of carbohydrates are pasta, rice, whole-grain breads and cereals, fruits, and vegetables.  If dairy products cause your symptoms to flare up, you can try eating less of those foods.  You might be able to handle yogurt better than other dairy products, because it contains bacteria that helps with digestion. Dairy products are an important source of calcium and other nutrients. If you need to avoid dairy products, be sure to talk with a Registered Dietitian about getting these nutrients through other food sources.  Drink enough water and fluids to keep your urine clear or pale yellow. This is important, especially if you have diarrhea. FOR MORE  INFORMATION  International Foundation for Functional Gastrointestinal Disorders: www.iffgd.org  National Digestive Diseases Information Clearinghouse: digestive.StageSync.si Document Released: 09/29/2003 Document Revised: 10/01/2011 Document Reviewed: 06/16/2007 [facility] Patient Information 2014 [place], [place]. Irritable Bowel Syndrome Irritable Bowel Syndrome (IBS) is caused by a disturbance of normal bowel function. Other terms used are spastic colon, mucous colitis, and irritable colon. It does not require surgery, nor does it lead to cancer. There is no cure for IBS. But with proper diet, stress reduction, and medication, you will find that your problems (symptoms) will gradually disappear or improve. IBS is a common digestive disorder. It usually appears in late adolescence or early adulthood. Women develop it twice as often as men. CAUSES  After food has been digested and absorbed in the small intestine, waste material is moved into the colon (large intestine). In the colon, water and salts are absorbed from the undigested products coming from the small intestine. The remaining residue, or fecal material, is held for elimination. Under normal circumstances, gentle, rhythmic contractions on the bowel walls push the fecal material along the colon towards the rectum. In IBS, however, these contractions are irregular and poorly coordinated. The fecal material is either retained too long, resulting in constipation, or expelled too soon, producing diarrhea. SYMPTOMS  The most common symptom of IBS is pain. It is typically in the lower left side of the belly (abdomen). But it may occur anywhere in the abdomen. It can be felt as heartburn, backache, or even as a dull pain in the arms or shoulders. The pain comes from excessive bowel-muscle spasms and from the buildup of gas and fecal material in the colon. This pain:  Can range from sharp belly (abdominal) cramps to a dull, continuous ache.  Usually  worsens soon after eating.  Is typically relieved by having a bowel movement or passing gas. Abdominal pain is usually accompanied by constipation. But it may also produce diarrhea. The diarrhea typically occurs right after a meal or upon arising in the morning. The stools are typically soft and watery. They are often flecked with secretions (mucus). Other symptoms of IBS include:  Bloating.  Loss of appetite.  Heartburn.  Feeling sick to your stomach (nausea).  Belching  Vomiting  Gas. IBS may also cause a number of symptoms that are unrelated to the digestive system:  Fatigue.  Headaches.  Anxiety  Shortness of breath  Difficulty in concentrating.  Dizziness. These symptoms tend to come and go. DIAGNOSIS  The symptoms of IBS closely mimic the symptoms of other, more serious digestive disorders. So your caregiver may wish to perform a variety of additional tests to exclude these disorders. He/she wants to be certain of learning what is wrong (diagnosis). The nature and purpose of each test will be explained to you. TREATMENT A number of medications are available to help correct bowel function and/or relieve bowel spasms and abdominal pain. Among the drugs available are:  Mild, non-irritating laxatives for severe constipation and to help restore normal bowel habits.  Specific anti-diarrheal medications to treat severe or  prolonged diarrhea.  Anti-spasmodic agents to relieve intestinal cramps.  Your caregiver may also decide to treat you with a mild tranquilizer or sedative during unusually stressful periods in your life. The important thing to remember is that if any drug is prescribed for you, make sure that you take it exactly as directed. Make sure that your caregiver knows how well it worked for you. HOME CARE INSTRUCTIONS   Avoid foods that are high in fat or oils. Some examples [id number]:[id number] cream, butter, frankfurters, sausage, and other fatty meats.  Avoid foods  that have a laxative effect, such as fruit, fruit juice, and dairy products.  Cut out carbonated drinks, chewing gum, and "gassy" foods, such as beans and cabbage. This may help relieve bloating and belching.  Bran taken with plenty of liquids may help relieve constipation.  Keep track of what foods seem to trigger your symptoms.  Avoid emotionally charged situations or circumstances that produce anxiety.  Start or continue exercising.  Get plenty of rest and sleep. MAKE SURE YOU:   Understand these instructions.  Will watch your condition.  Will get help right away if you are not doing well or get worse. Document Released: 07/09/2005 Document Revised: 10/01/2011 Document Reviewed: 02/27/2008 [facility] Patient Information 2014 [place], [place].

## 2013-03-03 ENCOUNTER — Encounter (INDEPENDENT_AMBULATORY_CARE_PROVIDER_SITE_OTHER): Payer: Self-pay | Admitting: General Surgery

## 2013-03-03 ENCOUNTER — Ambulatory Visit (INDEPENDENT_AMBULATORY_CARE_PROVIDER_SITE_OTHER): Payer: BC Managed Care – PPO | Admitting: General Surgery

## 2013-03-03 VITALS — BP 140/94 | HR 88 | Temp 96.9°F | Ht 69.0 in | Wt 269.6 lb

## 2013-03-03 DIAGNOSIS — R1013 Epigastric pain: Secondary | ICD-10-CM

## 2013-03-03 NOTE — Patient Instructions (Signed)
Call if symptoms continue and we can order a HIDA scan

## 2013-03-10 NOTE — Progress Notes (Signed)
Patient ID: Jim Goodwin, male   DOB: [birth year], 37 y.o.   MRN: [id number]  Chief Complaint  Patient presents with  . New Evaluation    eval LLQ pain    HPI Jim Goodwin is a 37 y.o. male.  We're asked to see the patient in consultation by Dr. Tawanna Cooler to evaluate him for abdominal pain. The patient is a 37 year old white male who first experienced a sickness back in May of this year. He was experiencing nausea and vomiting and diarrhea. There were other members of his family that had a similar illness. She states this took about 5 days to resolve. This happened again in June. He mostly complains of epigastric and left lower quadrant pain. He had a recent ultrasound that showed no evidence of biliary disease. He has no complaints today.  HPI  Past Medical History  Diagnosis Date  . Allergy   . Hypertension   . Hyperlipidemia   . Arthritis   . Nephrolithiasis     Past Surgical History  Procedure Laterality Date  . Knee arthroscopy      left  . Hydrocele excision      93-94    Family History  Problem Relation Age of Onset  . Kidney disease Father   . Hypertension Other   . Cancer Paternal Grandmother     Lung    Social History History  Substance Use Topics  . Smoking status: Former Smoker    Quit date: 07/23/2005  . Smokeless tobacco: Former Neurosurgeon  . Alcohol Use: 1.2 oz/week    1 Cans of beer, 1 Shots of liquor per week     Comment: maybe on a week    Allergies  Allergen Reactions  . Duracillin As [Penicillin G Procaine]     GI upset    Current Outpatient Prescriptions  Medication Sig Dispense Refill  . aspirin 81 MG EC tablet Take 81 mg by mouth daily.        . cetirizine (ZYRTEC) 10 MG tablet Take 10 mg by mouth daily.        Marland Kitchen doxazosin (CARDURA) 8 MG tablet Take 1 tablet (8 mg total) by mouth at bedtime.  100 tablet  3  . fluticasone (FLONASE) 50 MCG/ACT nasal spray Place 1 spray into the nose daily. As needed for additional allergy relief.  16 g  11  .  promethazine (PHENERGAN) 25 MG tablet Take 1 tablet (25 mg total) by mouth every 8 (eight) hours as needed for nausea.  20 tablet  0  . simvastatin (ZOCOR) 40 MG tablet Take 1 tablet (40 mg total) by mouth at bedtime.  100 tablet  3  . hyoscyamine (LEVSIN SL) 0.125 MG SL tablet Place 1 tablet (0.125 mg total) under the tongue every 4 (four) hours as needed for cramping.  30 tablet  0   No current facility-administered medications for this visit.    Review of Systems Review of Systems  Constitutional: Negative.   HENT: Negative.   Eyes: Negative.   Respiratory: Negative.   Cardiovascular: Negative.   Gastrointestinal: Negative.   Endocrine: Negative.   Genitourinary: Negative.   Musculoskeletal: Negative.   Skin: Negative.   Allergic/Immunologic: Negative.   Neurological: Negative.   Hematological: Negative.   Psychiatric/Behavioral: Negative.     Blood pressure 140/94, pulse 88, temperature 96.9 F (36.1 C), temperature source Temporal, height 5\' 9"  (1.753 m), weight 269 lb 9.6 oz (122.29 kg), SpO2 98.00%.  Physical Exam Physical Exam  Constitutional: He  is oriented to person, place, and time. He appears well-developed and well-nourished.  HENT:  Head: Normocephalic and atraumatic.  Eyes: Conjunctivae and EOM are normal. Pupils are equal, round, and reactive to light.  Neck: Normal range of motion. Neck supple.  Cardiovascular: Normal rate, regular rhythm and normal heart sounds.   Pulmonary/Chest: Effort normal and breath sounds normal.  Abdominal: Soft. Bowel sounds are normal. There is no tenderness.  Musculoskeletal: Normal range of motion.  Neurological: He is alert and oriented to person, place, and time.  Skin: Skin is warm and dry.  Psychiatric: He has a normal mood and affect. His behavior is normal.    Data Reviewed As above  Assessment    The patient is having intermittent episodes of nausea vomiting and diarrhea. His ultrasound showed no evidence of  gallstones or cholecystitis. It is still possible he could have biliary dyskinesia causing some of the symptoms. I have offered to obtain a HIDA scan for him. He has declined.    Plan    He agrees to call us if his symptoms recur and we will order the HIDA scan for him        TOTH III,Karington Zarazua S 03/10/2013, 8:56 AM

## 2013-03-26 ENCOUNTER — Encounter: Payer: Self-pay | Admitting: Family Medicine

## 2013-03-26 ENCOUNTER — Ambulatory Visit (INDEPENDENT_AMBULATORY_CARE_PROVIDER_SITE_OTHER): Payer: BC Managed Care – PPO | Admitting: Family Medicine

## 2013-03-26 VITALS — BP 132/82 | HR 102 | Temp 97.6°F | Wt 266.0 lb

## 2013-03-26 DIAGNOSIS — F4010 Social phobia, unspecified: Secondary | ICD-10-CM | POA: Insufficient documentation

## 2013-03-26 DIAGNOSIS — E669 Obesity, unspecified: Secondary | ICD-10-CM | POA: Insufficient documentation

## 2013-03-26 DIAGNOSIS — R1013 Epigastric pain: Secondary | ICD-10-CM

## 2013-03-26 MED ORDER — SERTRALINE HCL 50 MG PO TABS: 50.0000 mg | ORAL_TABLET | Freq: Every day | ORAL | Status: DC

## 2013-03-26 NOTE — Progress Notes (Signed)
  Subjective:    Patient ID: Jim Goodwin, male    DOB: 25-Apr-1976, 37 y.o.   MRN: [id number]  HPI Patient has long history of recurrent GI issues. He was seen earlier this summer and had ultrasound to evaluate some epigastric and right upper quadrant pains. This was unremarkable. Lab work also was unremarkable. He had some episodic upper abdominal discomfort which is not related to eating. Has never had any chest pains. No dysphagia. He thinks this may be stress related.  Sometimes has early morning nausea and occasional gagging in the morning. He relates a long history of anxiety. He's had difficulty especially in social settings around others. Sometimes leading to avoidance behavior. Frequently feels anxious in crowds.  He has lots of job stress. Sometimes tends to obsess and behavior regarding reviewing things he has done repeatedly in his head. He relates some mild to moderate seasonal affective disorder with a pattern of depressed mood in the wintertime generally. His major issue however is anxiety related as above. No history of panic disorder. Symptoms are fairly pervasive. Has never been treated with medication or any significant counseling.  Past Medical History  Diagnosis Date  . Allergy   . Hypertension   . Hyperlipidemia   . Arthritis   . Nephrolithiasis    Past Surgical History  Procedure Laterality Date  . Knee arthroscopy      left  . Hydrocele excision      93-94    reports that he quit smoking about 7 years ago. He has quit using smokeless tobacco. He reports that he drinks about 1.2 ounces of alcohol per week. He reports that he does not use illicit drugs. family history includes Cancer in his paternal grandmother; Hypertension in his other; Kidney disease in his father. Allergies  Allergen Reactions  . Duracillin As [Penicillin G Procaine]     GI upset      Review of Systems  Constitutional: Negative for fever, chills, appetite change and unexpected  weight change.  HENT: Negative for trouble swallowing.   Respiratory: Negative for cough and shortness of breath.   Cardiovascular: Negative for chest pain.  Gastrointestinal: Positive for abdominal pain. Negative for nausea, vomiting, diarrhea, constipation, blood in stool and abdominal distention.  Psychiatric/Behavioral: Negative for suicidal ideas and agitation. The patient is nervous/anxious.        Objective:   Physical Exam  Constitutional: He is oriented to person, place, and time. He appears well-developed and well-nourished.  Cardiovascular: Normal rate and regular rhythm.   Pulmonary/Chest: Effort normal and breath sounds normal. No respiratory distress. He has no wheezes. He has no rales.  Abdominal: Soft. Bowel sounds are normal. He exhibits no distension and no mass. There is no tenderness. There is no rebound and no guarding.  Musculoskeletal: He exhibits no edema.  Neurological: He is alert and oriented to person, place, and time.  Psychiatric: He has a normal mood and affect. His behavior is normal.  Slightly anxious but appropriate in interactions otherwise          Assessment & Plan:  #1 intermittent epigastric pains. Suspect at least partly related to anxiety/stress. He's had workup as above which is unrevealing. He does not have any red flags such as weight loss, melena, hematemesis, loss of appetite, etc. #2 chronic anxiety. Very likely social anxiety disorder. We discussed options. Discussed possible counseling. Start sertraline 50 mg once daily. Reviewed possible side effects. Reassess with primary in 3-4 weeks

## 2013-03-26 NOTE — Patient Instructions (Addendum)
Social Anxiety Disorder Almost everyone can feel some degree of discomfort in a given social situation. However, when you feel extreme fear of social encounters and it begins to interfere with your daily functioning, you have social anxiety disorder. There are two types of this disorder.  If you have the first type, you are extremely anxious in only a few specific situations. For instance, you become anxious when answering a question out loud in class or presenting at work.  If you have the second type, you experience overwhelming worry in most or all social experiences. This may include everything from going to a doctor's appointment, to eating in a restaurant, to entering a crowded room. When this disorder happens in very young children, it may be from a new babysitter or stranger that the child is not used to. In the very young it may show up as crying, tantrums, or withdrawal.  Most adults with social anxiety were shy and timid as children. If left untreated, these adults may appear quiet and passive in social situations. They may be highly sensitive to the criticism and disapproval of others. They may have no close friends outside of first degree relatives. They may be fearful of saying or doing something foolish or becoming emotional in front of others. As a result of these fears, they may avoid most social encounters and select jobs and personal activities that allow them to isolate themselves from others.  CAUSES  This disorder can result from the combination of several factors.   Your genetic makeup affects how sensitive you are and how much stress you can tolerate.  How you were raised as a child also plays a part. Research suggests that children raised with overprotective parents, excessive expectations, overly critical parents, low assertiveness, and/or emotional insecurity have increased feelings of anxiety.  A traumatic life event can also contribute to social anxiety; for example, being  pointed out and shamed in public or being repeatedly bullied. SYMPTOMS  This disorder is characterized by a fear of social situations. The anxiety is marked by:  Apprehension.  Nervousness.  A feeling of unease, worry, or tension. Anxiety may also be reflected in:  Blushing.  Restlessness.  Trembling.  Shortness of breath.  Sweating.  Muscle tics and twitches. At higher levels of anxiety, there also may be:  Increased heart rate.  A rise in blood pressure.  Rapid breathing.  Muscle tension. Experiencing these uncomfortable symptoms often enough can cause a person to avoid social situations. This can cause many problems in the anxiety sufferer's life.  TREATMENT  There are many types of treatment available for social anxiety disorder.  Group therapy allows you to see that you are not alone with these problems.  Individual therapy helps you address anxiety issues with a caring professional.  Relaxation techniques may be used as tools to help you overcome fear.  Hypnosis may help change the way you think about social settings.  Numerous medications are available that your caregiver can prescribe to help during a difficult time. Medications can be used for a brief period of time. The goal of this treatment is to help recondition you so that once you quit taking the medications, your anxiety will not return. PROGNOSIS  Social anxiety disorder is a common problem that is very treatable. Individuals who participate in treatment have a very high success rate. When treated properly, the prognosis is very good to excellent. Document Released: 06/07/2005 Document Revised: 10/01/2011 Document Reviewed: 06/03/2007 [facility] Patient Information 2014 [place], [place].

## 2013-04-17 ENCOUNTER — Ambulatory Visit (INDEPENDENT_AMBULATORY_CARE_PROVIDER_SITE_OTHER): Payer: BC Managed Care – PPO | Admitting: Family Medicine

## 2013-04-17 ENCOUNTER — Encounter: Payer: Self-pay | Admitting: Family Medicine

## 2013-04-17 VITALS — BP 138/90 | HR 97 | Temp 98.3°F | Wt 259.0 lb

## 2013-04-17 DIAGNOSIS — F4010 Social phobia, unspecified: Secondary | ICD-10-CM

## 2013-04-17 DIAGNOSIS — Z23 Encounter for immunization: Secondary | ICD-10-CM

## 2013-04-17 NOTE — Progress Notes (Signed)
  Subjective:    Patient ID: Jim Goodwin, male    DOB: [birth year], 37 y.o.   MRN: [id number]  HPI Patient seen for followup regarding anxiety disorder. We suspected mostly social anxiety. We started sertraline and he has seen tremendous improvement. In fact, his gastrointestinal symptoms have also improved. We had suspected that some of those may been anxiety related. He's noticed easier time going out in public. He has also noticed less difficulty dealing with stress issues at home. His wife has noticed tremendous improvement. He has not had any significant side effects other than mild sedation at night.  Past Medical History  Diagnosis Date  . Allergy   . Hypertension   . Hyperlipidemia   . Arthritis   . Nephrolithiasis    Past Surgical History  Procedure Laterality Date  . Knee arthroscopy      left  . Hydrocele excision      93-94    reports that he quit smoking about 7 years ago. He has quit using smokeless tobacco. He reports that he drinks about 1.2 ounces of alcohol per week. He reports that he does not use illicit drugs. family history includes Cancer in his paternal grandmother; Hypertension in his other; Kidney disease in his father. Allergies  Allergen Reactions  . Duracillin As [Penicillin G Procaine]     GI upset      Review of Systems  Constitutional: Negative for appetite change and unexpected weight change.  Psychiatric/Behavioral: Negative for dysphoric mood and agitation.       Objective:   Physical Exam  Constitutional: He appears well-developed and well-nourished.  Cardiovascular: Normal rate and regular rhythm.   Pulmonary/Chest: Effort normal and breath sounds normal. No respiratory distress. He has no wheezes. He has no rales.  Psychiatric: He has a normal mood and affect. His behavior is normal.          Assessment & Plan:  Anxiety. Social anxiety disorder. Improved with sertraline. Continue sertraline 50 mg once daily. Consider followup  with primary in 6 months

## 2013-06-01 ENCOUNTER — Ambulatory Visit (INDEPENDENT_AMBULATORY_CARE_PROVIDER_SITE_OTHER): Payer: BC Managed Care – PPO | Admitting: Family Medicine

## 2013-06-01 ENCOUNTER — Encounter: Payer: Self-pay | Admitting: Family Medicine

## 2013-06-01 VITALS — BP 160/100 | Temp 98.4°F | Wt 259.0 lb

## 2013-06-01 DIAGNOSIS — I10 Essential (primary) hypertension: Secondary | ICD-10-CM

## 2013-06-01 DIAGNOSIS — F4010 Social phobia, unspecified: Secondary | ICD-10-CM

## 2013-06-01 MED ORDER — LISINOPRIL 10 MG PO TABS: 10.0000 mg | ORAL_TABLET | Freq: Every day | ORAL | Status: DC

## 2013-06-01 MED ORDER — SERTRALINE HCL 50 MG PO TABS: 50.0000 mg | ORAL_TABLET | Freq: Every day | ORAL | Status: DC

## 2013-06-01 MED ORDER — LORAZEPAM 1 MG PO TABS: ORAL_TABLET | ORAL | Status: DC

## 2013-06-01 NOTE — Patient Instructions (Signed)
Continue Zoloft 50 mg daily  Lisinopril 10 mg,,,,,,,,, one tablet daily in the morning for blood pressure control  Check your blood pressure daily in the morning while you're at home  Return in 2 weeks for followup  Ativan 1 mg......... one half to one tablet one hour before flying......Marland Kitchen get up and walk every 2 hours remember to take an aspirin tablet before you fly to prevent blood clots

## 2013-06-01 NOTE — Progress Notes (Signed)
  Subjective:    Patient ID: Jim Goodwin, male    DOB: June 11, 1976, 37 y.o.   MRN: [id number]  HPI Jim Goodwin is a 37 year old married male nonsmoker who comes in today for evaluation of 3 issues  He was seen in September by Dr. Bb and complaining of abdominal pain. It felt like is most likely stress related and he was started on Zoloft 50 mg daily. He comes back today for followup having been on the Zoloft now for 7 weeks. He feels 90% better.  His blood pressure continues to be elevated despite being on Cardura 8 mg daily today BP 160/100. This is consistent with a blood pressure readings he's getting at home.  He stupefied [place] on business and flying makes him anxious. He wants to know if he can take any medication he can decrease the anxiety a fly  Mom has hypertension   Review of Systems    review of systems otherwise negative Objective:   Physical Exam  Well-developed well-nourished male in no acute distress BP right arm sitting position 160/100 weight 259.      Assessment & Plan:  Abdominal distress secondary to stress continue Zoloft 50 mg daily  Stress related to flying,,,,,,,, Ativan 1 mg,,,,,,, advised to take an aspirin walk every hour and a half to not fall asleep on the airplane   Hypertension not at goal add ACE

## 2013-06-01 NOTE — Progress Notes (Signed)
Pre visit review using our clinic review tool, if applicable. No additional management support is needed unless otherwise documented below in the visit note. 

## 2013-06-24 ENCOUNTER — Encounter: Payer: Self-pay | Admitting: *Deleted

## 2013-06-25 ENCOUNTER — Ambulatory Visit (INDEPENDENT_AMBULATORY_CARE_PROVIDER_SITE_OTHER): Payer: BC Managed Care – PPO | Admitting: Family Medicine

## 2013-06-25 ENCOUNTER — Encounter: Payer: Self-pay | Admitting: Family Medicine

## 2013-06-25 VITALS — BP 132/74 | Temp 97.5°F | Wt 266.0 lb

## 2013-06-25 DIAGNOSIS — F4010 Social phobia, unspecified: Secondary | ICD-10-CM

## 2013-06-25 DIAGNOSIS — I10 Essential (primary) hypertension: Secondary | ICD-10-CM

## 2013-06-25 LAB — BASIC METABOLIC PANEL
BUN: 14 mg/dL (ref 6–23)
CO2: 30 mEq/L (ref 19–32)
Calcium: 9.2 mg/dL (ref 8.4–10.5)
Chloride: 103 mEq/L (ref 96–112)
Creatinine, Ser: 0.9 mg/dL (ref 0.4–1.5)

## 2013-06-25 NOTE — Patient Instructions (Signed)
Continue the lisinopril 10 mg and the Cardura 8 mg,,,,,,,,,,,, one of each daily for high blood pressure control  Check your blood pressure weekly since his drop back to normal  Return when necessary

## 2013-06-25 NOTE — Progress Notes (Signed)
Pre visit review using our clinic review tool, if applicable. No additional management support is needed unless otherwise documented below in the visit note. 

## 2013-06-25 NOTE — Progress Notes (Signed)
   Subjective:    Patient ID: Jim Goodwin, male    DOB: 09-12-1975, 37 y.o.   MRN: [id number]  HPI  Jim Goodwin is a 37 year old married male nonsmoker who comes in today for evaluation of 2 problems  We saw a month ago with elevated blood pressure. He was on Cardura 8 mg daily despite that his BP was 150/90. We added 10 mg of lisinopril now is blood pressure is 132/74. Blood pressure at home is 120/80 average  No side effects to medication  He recently went to [place] and he took Ativan 1 mg plier to flying and did well  Review of Systems Review of systems negative    Objective:   Physical Exam  Well-developed well-nourished male no acute distress vital signs stable is afebrile BP right arm sitting position 132/74 pulse 70 and regular      Assessment & Plan:

## 2013-07-18 ENCOUNTER — Other Ambulatory Visit: Payer: Self-pay | Admitting: Family Medicine

## 2013-09-25 ENCOUNTER — Other Ambulatory Visit: Payer: Self-pay | Admitting: Family Medicine

## 2013-10-24 ENCOUNTER — Other Ambulatory Visit: Payer: Self-pay | Admitting: Family Medicine

## 2013-10-26 ENCOUNTER — Encounter: Payer: Self-pay | Admitting: Family Medicine

## 2013-10-26 ENCOUNTER — Ambulatory Visit (INDEPENDENT_AMBULATORY_CARE_PROVIDER_SITE_OTHER): Payer: BC Managed Care – PPO | Admitting: Family Medicine

## 2013-10-26 VITALS — BP 130/90 | Temp 98.9°F | Wt 277.0 lb

## 2013-10-26 DIAGNOSIS — N200 Calculus of kidney: Secondary | ICD-10-CM

## 2013-10-26 DIAGNOSIS — I10 Essential (primary) hypertension: Secondary | ICD-10-CM

## 2013-10-26 MED ORDER — DOXAZOSIN MESYLATE 8 MG PO TABS: 8.0000 mg | ORAL_TABLET | Freq: Every day | ORAL | Status: DC

## 2013-10-26 NOTE — Progress Notes (Signed)
Pre visit review using our clinic review tool, if applicable. No additional management support is needed unless otherwise documented below in the visit note. 

## 2013-10-26 NOTE — Progress Notes (Signed)
   Subjective:    Patient ID: Jim Goodwin, male    DOB: 02/20/1976, 38 y.o.   MRN: [id number]  HPI Jim Goodwin is a 38 year old married male nonsmoker who certainly got brought in for an office visit with me one year refill of a prescription.  His physical set up for June 12   Review of Systems Negative    Objective:   Physical Exam  Well-developed well-nourished male no acute distress vital signs stable he is afebrile      Assessment & Plan:  Hypertension at goal refill medication

## 2013-10-26 NOTE — Patient Instructions (Signed)
Return June 12 for your annual physical examination

## 2013-10-27 ENCOUNTER — Telehealth: Payer: Self-pay | Admitting: Family Medicine

## 2013-10-27 NOTE — Telephone Encounter (Signed)
Relevant patient education assigned to patient using Emmi. ° °

## 2013-10-29 IMAGING — US US ABDOMEN LIMITED
1 series · 14 of 25 positions shown · non-contrast
Comparison: none

Ultrasound right upper quadrant
HISTORY: Right upper quadrant pain with nausea and vomiting

[Series 1: us abdomen limited · 0.29mm/px · 14 of 44 slices shown]
[im 1/44]
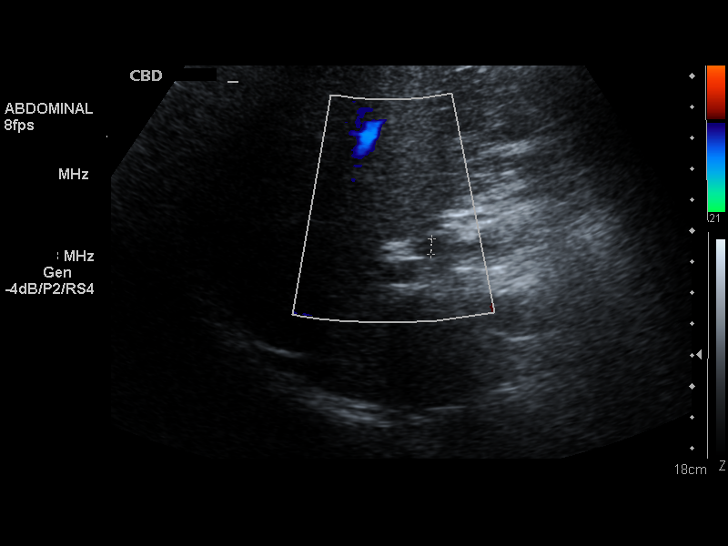
[im 4/44]
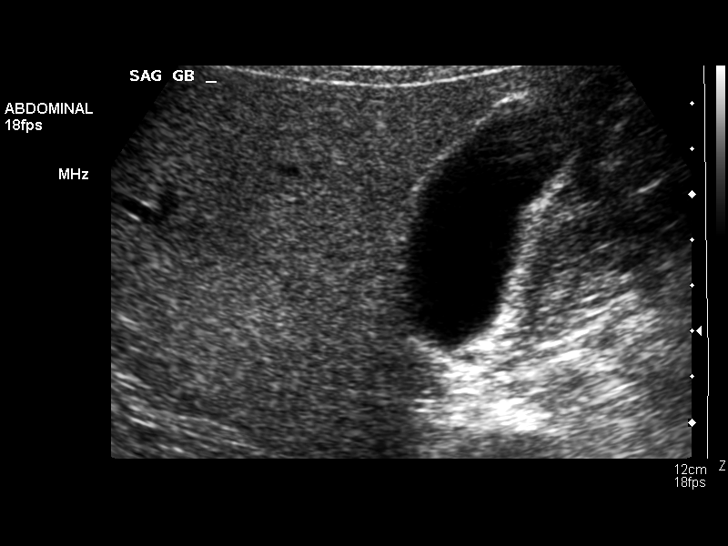
[im 8/44]
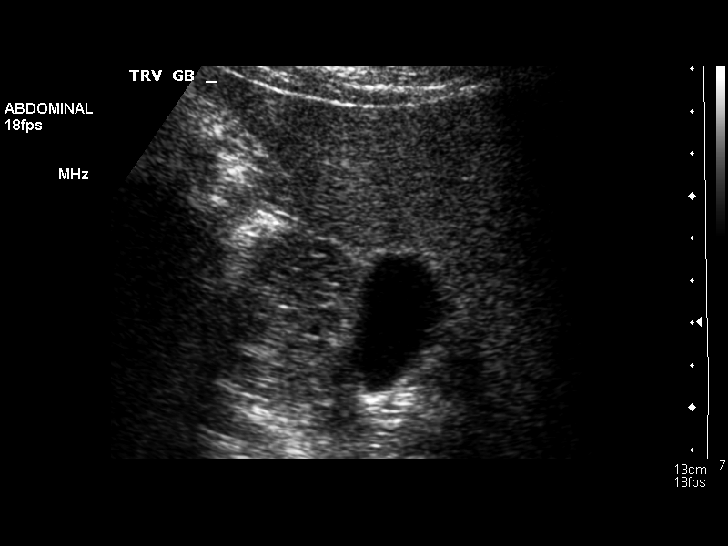
[im 11/44]
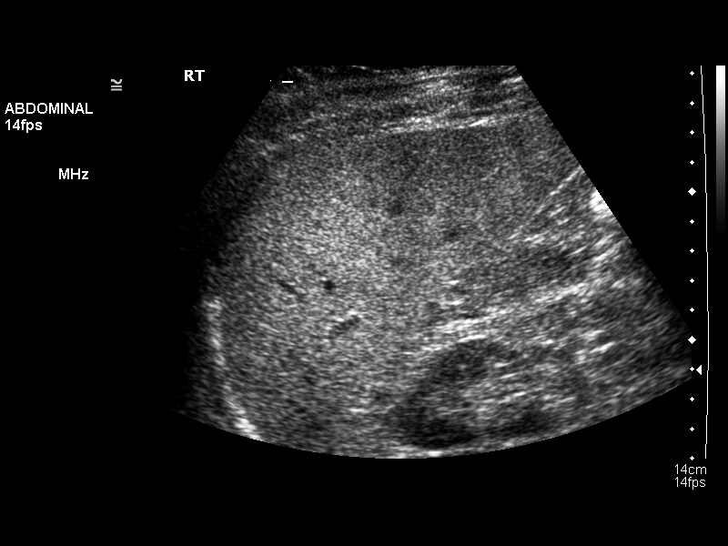
[im 15/44]
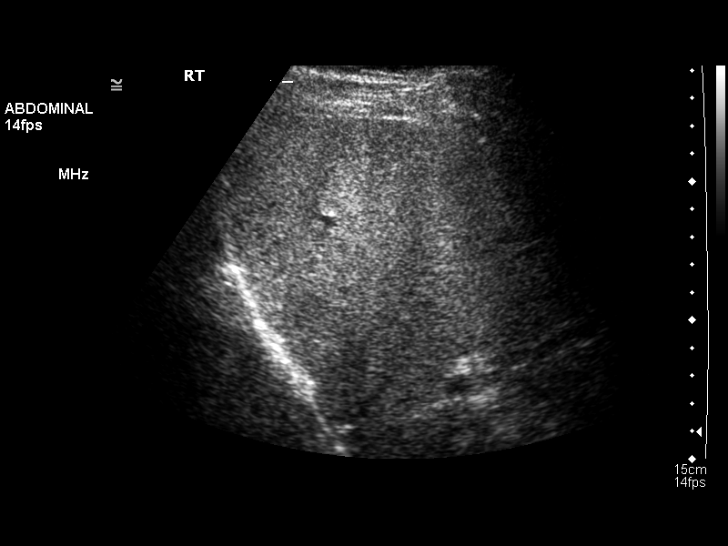
[im 17/44]
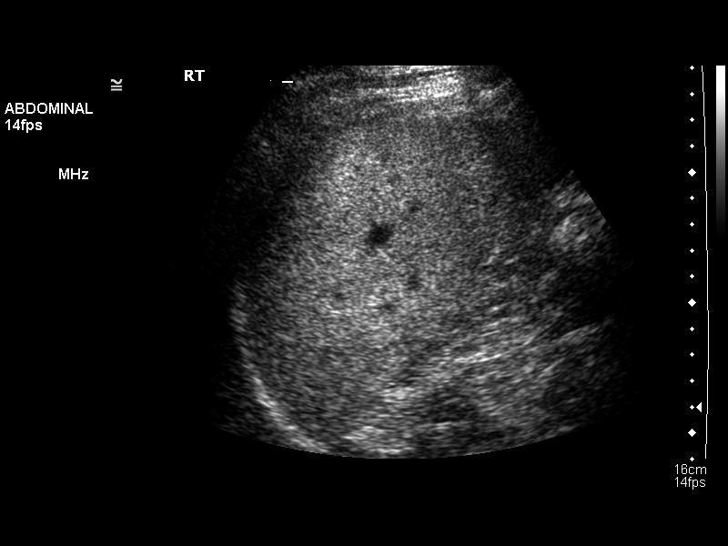
[im 20/44]
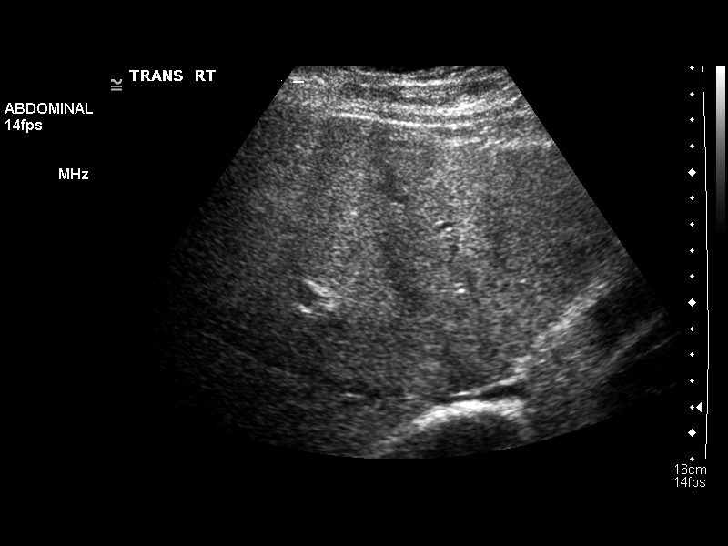
[im 24/44]
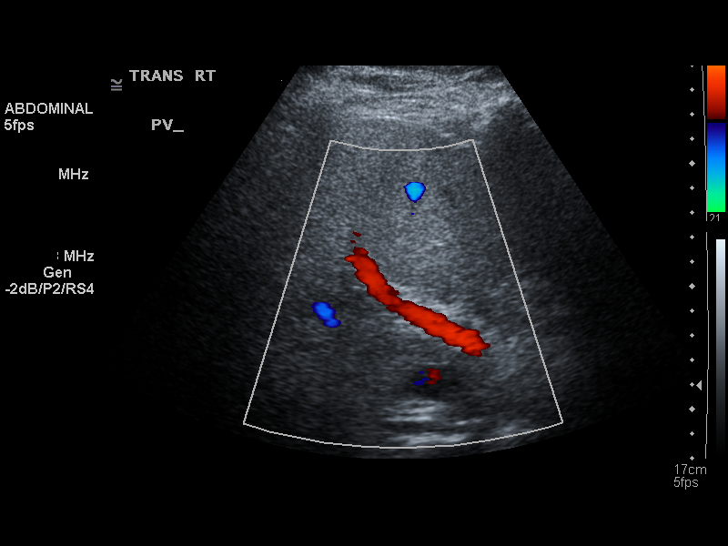
[im 27/44]
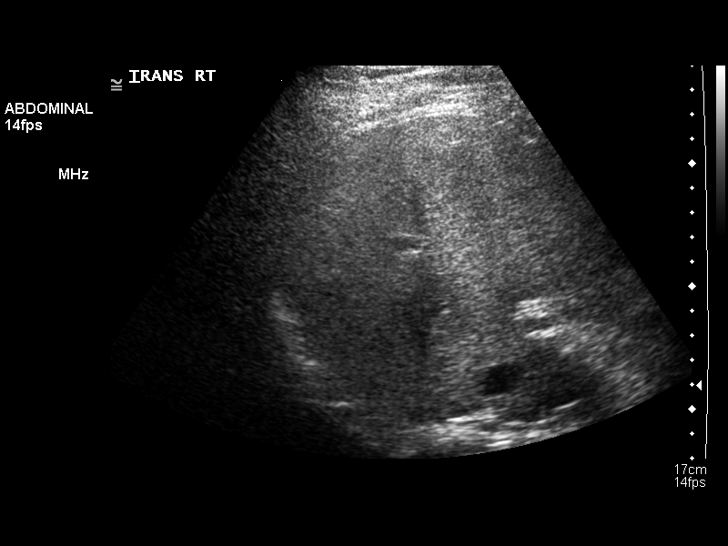
[im 29/44]
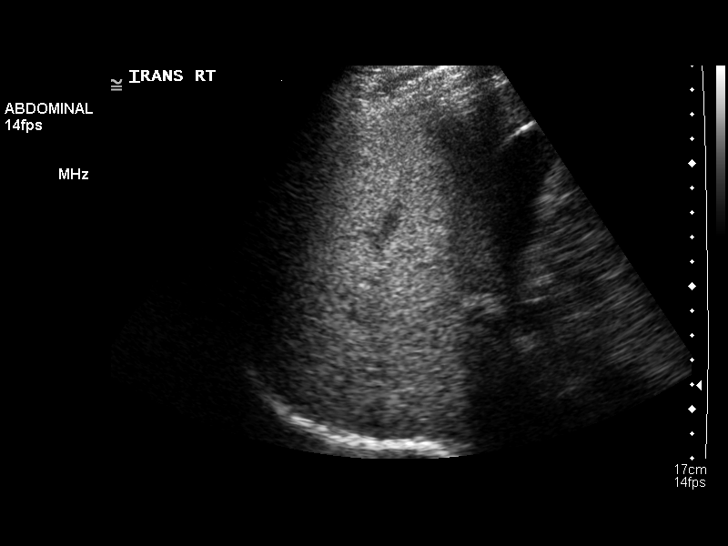
[im 33/44]
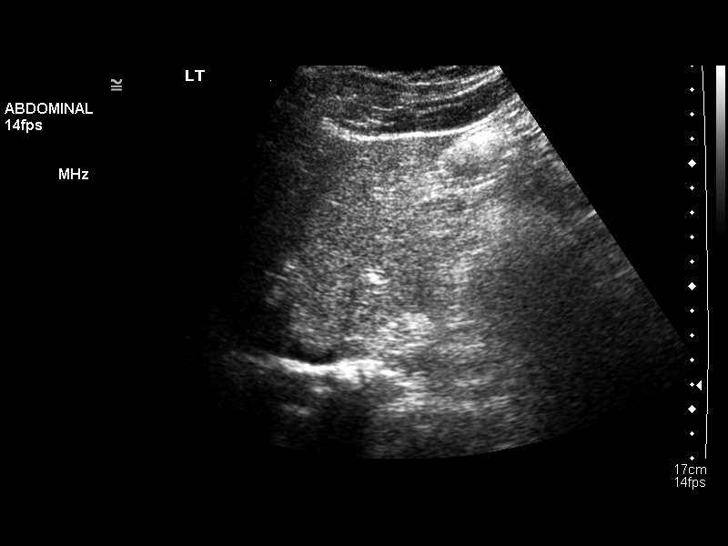
[im 36/44]
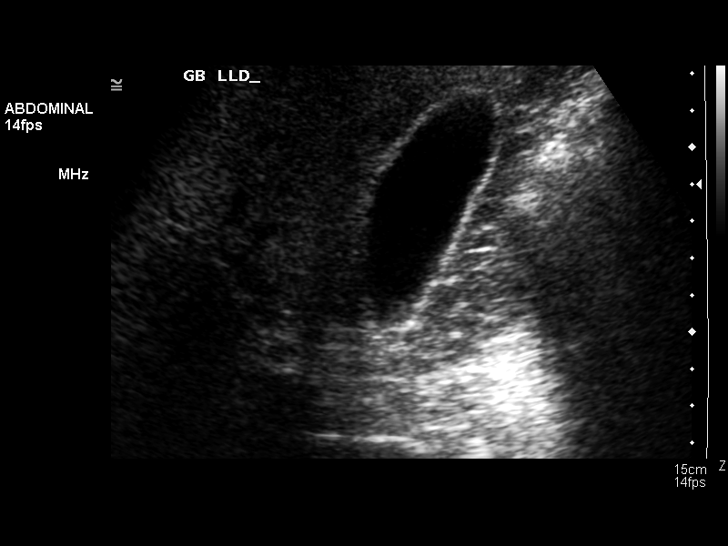
[im 40/44]
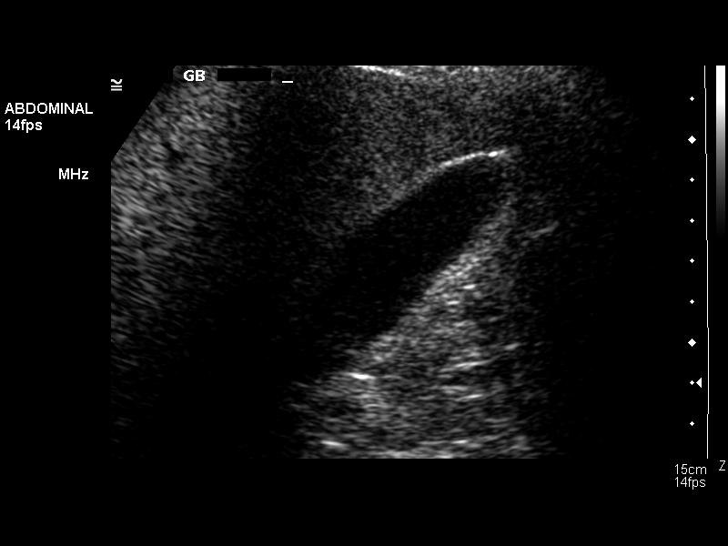
[im 44/44]
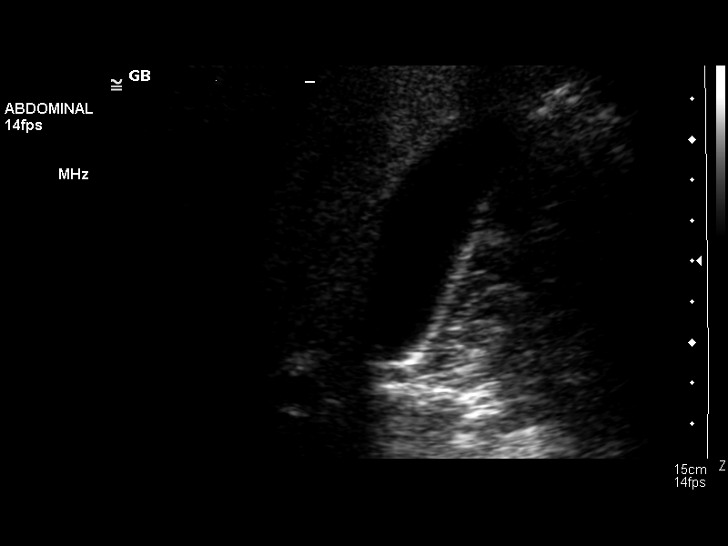

[14 of 25 positions shown; findings below may reference images not displayed]

FINDINGS: Gallbladder is visualized in multiple projections.
There are no gallstones, gallbladder wall thickening, or
pericholecystic fluid collection.  There is no intrahepatic or
extrahepatic biliary duct dilatation.

Liver has a somewhat inhomogeneous appearance, consistent with
either fatty change or possibly underlying parenchymal disease.  No
well-defined mass lesion is identified on this study.  There is no
disruption of the hepatic architecture.
CONCLUSION: Inhomogeneous echotexture of the liver.  This finding
is consistent with either fatty infiltration or possibly underlying
parenchymal disease.  While no focal mass lesions are identified,
it must be cautioned that the sensitivity of ultrasound for more
subtle liver lesions is diminished given underlying fatty change
and / or underlying parenchymal disease.

Study otherwise unremarkable.

## 2013-11-13 ENCOUNTER — Telehealth: Payer: Self-pay | Admitting: Family Medicine

## 2013-11-13 DIAGNOSIS — I10 Essential (primary) hypertension: Secondary | ICD-10-CM

## 2013-11-13 NOTE — Telephone Encounter (Signed)
[facility] - [place], [place] - [address] is requesting re-fill on doxazosin (CARDURA) 8 MG tablet

## 2013-11-16 MED ORDER — DOXAZOSIN MESYLATE 8 MG PO TABS: 8.0000 mg | ORAL_TABLET | Freq: Every day | ORAL | Status: DC

## 2013-11-16 NOTE — Telephone Encounter (Signed)
Rx sent 

## 2013-12-20 ENCOUNTER — Other Ambulatory Visit: Payer: Self-pay | Admitting: Family Medicine

## 2013-12-24 ENCOUNTER — Other Ambulatory Visit (INDEPENDENT_AMBULATORY_CARE_PROVIDER_SITE_OTHER): Payer: BC Managed Care – PPO

## 2013-12-24 DIAGNOSIS — Z0000 Encounter for general adult medical examination without abnormal findings: Secondary | ICD-10-CM

## 2013-12-24 LAB — CBC WITH DIFFERENTIAL/PLATELET
BASOS ABS: 0 10*3/uL (ref 0.0–0.1)
Basophils Relative: 0.7 % (ref 0.0–3.0)
Eosinophils Absolute: 0.2 10*3/uL (ref 0.0–0.7)
Eosinophils Relative: 3.6 % (ref 0.0–5.0)
HCT: 45.1 % (ref 39.0–52.0)
Hemoglobin: 14.9 g/dL (ref 13.0–17.0)
LYMPHS PCT: 33.4 % (ref 12.0–46.0)
Lymphs Abs: 2.1 10*3/uL (ref 0.7–4.0)
MCHC: 33 g/dL (ref 30.0–36.0)
MCV: 90.2 fl (ref 78.0–100.0)
MONOS PCT: 6.9 % (ref 3.0–12.0)
Monocytes Absolute: 0.4 10*3/uL (ref 0.1–1.0)
NEUTROS PCT: 55.4 % (ref 43.0–77.0)
Neutro Abs: 3.4 10*3/uL (ref 1.4–7.7)
Platelets: 312 10*3/uL (ref 150.0–400.0)
RBC: 5 Mil/uL (ref 4.22–5.81)
RDW: 13 % (ref 11.5–15.5)
WBC: 6.1 10*3/uL (ref 4.0–10.5)

## 2013-12-24 LAB — LIPID PANEL
CHOLESTEROL: 153 mg/dL (ref 0–200)
HDL: 38.6 mg/dL — ABNORMAL LOW (ref >39.00)
LDL CALC: 90 mg/dL (ref 0–99)
NonHDL: 114.4
TRIGLYCERIDES: 124 mg/dL (ref 0.0–149.0)
Total CHOL/HDL Ratio: 4
VLDL: 24.8 mg/dL (ref 0.0–40.0)

## 2013-12-24 LAB — POCT URINALYSIS DIPSTICK
Bilirubin, UA: NEGATIVE
Blood, UA: NEGATIVE
GLUCOSE UA: NEGATIVE
KETONES UA: NEGATIVE
Leukocytes, UA: NEGATIVE
Nitrite, UA: NEGATIVE
Protein, UA: NEGATIVE
SPEC GRAV UA: 1.02
UROBILINOGEN UA: 0.2
pH, UA: 5.5

## 2013-12-24 LAB — BASIC METABOLIC PANEL
BUN: 17 mg/dL (ref 6–23)
CALCIUM: 9.2 mg/dL (ref 8.4–10.5)
CO2: 29 mEq/L (ref 19–32)
CREATININE: 1 mg/dL (ref 0.4–1.5)
Chloride: 105 mEq/L (ref 96–112)
GFR: 88.01 mL/min (ref >60.00)
Glucose, Bld: 84 mg/dL (ref 70–99)
Potassium: 4.5 mEq/L (ref 3.5–5.1)
SODIUM: 139 meq/L (ref 135–145)

## 2013-12-24 LAB — HEPATIC FUNCTION PANEL
ALBUMIN: 4 g/dL (ref 3.5–5.2)
ALK PHOS: 37 U/L — AB (ref 39–117)
ALT: 33 U/L (ref 0–53)
AST: 24 U/L (ref 0–37)
BILIRUBIN DIRECT: 0.1 mg/dL (ref 0.0–0.3)
Total Bilirubin: 0.5 mg/dL (ref 0.2–1.2)
Total Protein: 7 g/dL (ref 6.0–8.3)

## 2013-12-24 LAB — TSH: TSH: 1.35 u[IU]/mL (ref 0.35–4.50)

## 2013-12-31 ENCOUNTER — Ambulatory Visit (INDEPENDENT_AMBULATORY_CARE_PROVIDER_SITE_OTHER): Payer: BC Managed Care – PPO | Admitting: Family Medicine

## 2013-12-31 ENCOUNTER — Encounter: Payer: Self-pay | Admitting: Family Medicine

## 2013-12-31 VITALS — BP 120/80 | Temp 97.7°F | Ht 70.0 in | Wt 280.0 lb

## 2013-12-31 DIAGNOSIS — Z0000 Encounter for general adult medical examination without abnormal findings: Secondary | ICD-10-CM

## 2013-12-31 DIAGNOSIS — K589 Irritable bowel syndrome without diarrhea: Secondary | ICD-10-CM

## 2013-12-31 DIAGNOSIS — E669 Obesity, unspecified: Secondary | ICD-10-CM

## 2013-12-31 DIAGNOSIS — F4010 Social phobia, unspecified: Secondary | ICD-10-CM

## 2013-12-31 DIAGNOSIS — E785 Hyperlipidemia, unspecified: Secondary | ICD-10-CM

## 2013-12-31 DIAGNOSIS — J309 Allergic rhinitis, unspecified: Secondary | ICD-10-CM

## 2013-12-31 DIAGNOSIS — I10 Essential (primary) hypertension: Secondary | ICD-10-CM

## 2013-12-31 MED ORDER — LISINOPRIL 10 MG PO TABS: 10.0000 mg | ORAL_TABLET | Freq: Every day | ORAL | Status: DC

## 2013-12-31 MED ORDER — DOXAZOSIN MESYLATE 8 MG PO TABS: 8.0000 mg | ORAL_TABLET | Freq: Every day | ORAL | Status: DC

## 2013-12-31 MED ORDER — HYOSCYAMINE SULFATE 0.125 MG SL SUBL: 0.1250 mg | SUBLINGUAL_TABLET | SUBLINGUAL | Status: DC | PRN

## 2013-12-31 MED ORDER — SIMVASTATIN 40 MG PO TABS: ORAL_TABLET | ORAL | Status: DC

## 2013-12-31 MED ORDER — LORAZEPAM 1 MG PO TABS: ORAL_TABLET | ORAL | Status: DC

## 2013-12-31 MED ORDER — SERTRALINE HCL 50 MG PO TABS: 50.0000 mg | ORAL_TABLET | Freq: Every day | ORAL | Status: DC

## 2013-12-31 NOTE — Progress Notes (Signed)
Pre visit review using our clinic review tool, if applicable. No additional management support is needed unless otherwise documented below in the visit note. 

## 2013-12-31 NOTE — Patient Instructions (Signed)
Continue your current medications  Work heart this year on the nutrition and weight loss  Followup in 1 year sooner if any problems

## 2013-12-31 NOTE — Progress Notes (Signed)
   Subjective:    Patient ID: Jim Goodwin, male    DOB: 01-Jun-1976, 38 y.o.   MRN: [id number]  HPI Jim Goodwin is a 38 year old married male nonsmoker who comes in today for general physical examination because of a history of allergic rhinitis, hypertension, irritable bowel syndrome, anxiety with flying, and social anxiety, hyperlipidemia, and overweight.  He gets routine eye care, dental care, colonoscopy not due to age 38. No family history of any colon cancer or polyps  He states he's walking on a regular basis but not losing weight. His blood pressures averaging 120/80 in the morning   Review of Systems  Constitutional: Negative.   HENT: Negative.   Eyes: Negative.   Respiratory: Negative.   Cardiovascular: Negative.   Gastrointestinal: Negative.   Genitourinary: Negative.   Musculoskeletal: Negative.   Skin: Negative.   Neurological: Negative.   Psychiatric/Behavioral: Negative.        Objective:   Physical Exam  Nursing note and vitals reviewed. Constitutional: He is oriented to person, place, and time. He appears well-developed and well-nourished.  HENT:  Head: Normocephalic and atraumatic.  Right Ear: External ear normal.  Left Ear: External ear normal.  Nose: Nose normal.  Mouth/Throat: Oropharynx is clear and moist.  Eyes: Conjunctivae and EOM are normal. Pupils are equal, round, and reactive to light.  Neck: Normal range of motion. Neck supple. No JVD present. No tracheal deviation present. No thyromegaly present.  Cardiovascular: Normal rate, regular rhythm, normal heart sounds and intact distal pulses.  Exam reveals no gallop and no friction rub.   No murmur heard. Pulmonary/Chest: Effort normal and breath sounds normal. No stridor. No respiratory distress. He has no wheezes. He has no rales. He exhibits no tenderness.  Abdominal: Soft. Bowel sounds are normal. He exhibits no distension and no mass. There is no tenderness. There is no rebound and no guarding.    Genitourinary: Penis normal. No penile tenderness.  Musculoskeletal: Normal range of motion. He exhibits no edema and no tenderness.  Lymphadenopathy:    He has no cervical adenopathy.  Neurological: He is alert and oriented to person, place, and time. He has normal reflexes. No cranial nerve deficit. He exhibits normal muscle tone.  Skin: Skin is warm and dry. No rash noted. No erythema. No pallor.  Psychiatric: He has a normal mood and affect. His behavior is normal. Judgment and thought content normal.          Assessment & Plan:  Healthy male  Classic metabolic syndrome........ Overweight.......... Hypertension...... Hyperlipidemia........... again discussed diet exercise and weight loss. Also talked about a nutrition consult  Anxiety related to flying Ativan when necessary  History of social anxiety continue Zoloft 50 mg daily.

## 2014-03-15 ENCOUNTER — Encounter: Payer: Self-pay | Admitting: Physician Assistant

## 2014-03-15 ENCOUNTER — Ambulatory Visit (INDEPENDENT_AMBULATORY_CARE_PROVIDER_SITE_OTHER): Payer: BC Managed Care – PPO | Admitting: Physician Assistant

## 2014-03-15 VITALS — BP 128/80 | HR 96 | Temp 98.4°F | Resp 18 | Wt 282.0 lb

## 2014-03-15 DIAGNOSIS — R062 Wheezing: Secondary | ICD-10-CM

## 2014-03-15 DIAGNOSIS — J069 Acute upper respiratory infection, unspecified: Secondary | ICD-10-CM

## 2014-03-15 MED ORDER — PREDNISONE 10 MG PO TABS: ORAL_TABLET | ORAL | Status: DC

## 2014-03-15 NOTE — Progress Notes (Signed)
Subjective:    Patient ID: Jim Goodwin, male    DOB: Jun 11, 1976, 38 y.o.   MRN: [id number]  Cough This is a new problem. The current episode started yesterday. The problem has been unchanged. The problem occurs hourly. The cough is productive of sputum. Associated symptoms include chills (resolved today as well), a fever (100.8F yesterday, took iubuprofen for this, and it resolved.), headaches (mild), myalgias, nasal congestion, a rash (noticed on back. thought might have gotten stung by insect. has resolved since.), shortness of breath (some, mild, resolves with rest), sweats (yesterday, resolved today.) and wheezing (some). Pertinent negatives include no chest pain, ear congestion, ear pain, heartburn, hemoptysis, postnasal drip, rhinorrhea, sore throat or weight loss. Nothing aggravates the symptoms. Treatments tried: ibuprofen, zyrtec, flonase. The treatment provided mild relief. His past medical history is significant for environmental allergies. There is no history of asthma or COPD.      Review of Systems  Constitutional: Positive for fever (100.8F yesterday, took iubuprofen for this, and it resolved.) and chills (resolved today as well). Negative for weight loss.  HENT: Positive for congestion and sinus pressure (mild bilateral maxillary.). Negative for ear pain, postnasal drip, rhinorrhea and sore throat.   Respiratory: Positive for cough, shortness of breath (some, mild, resolves with rest) and wheezing (some). Negative for hemoptysis.   Cardiovascular: Negative for chest pain.  Gastrointestinal: Negative for heartburn, nausea, vomiting and diarrhea (mild loose stool, not liquidy.).  Musculoskeletal: Positive for myalgias.  Skin: Positive for rash (noticed on back. thought might have gotten stung by insect. has resolved since.).  Allergic/Immunologic: Positive for environmental allergies.  Neurological: Positive for headaches (mild). Negative for syncope.  All other systems  reviewed and are negative.    Past Medical History  Diagnosis Date  . Allergy   . Hypertension   . Hyperlipidemia   . Arthritis   . Nephrolithiasis     History   Social History  . Marital Status: Married    Spouse Name: N/A    Number of Children: N/A  . Years of Education: N/A   Occupational History  . Not on file.   Social History Main Topics  . Smoking status: Former Smoker    Quit date: 07/23/2005  . Smokeless tobacco: Former Neurosurgeon  . Alcohol Use: 1.2 oz/week    1 Cans of beer, 1 Shots of liquor per week     Comment: maybe on a week  . Drug Use: No  . Sexual Activity: Yes    Partners: Female, Male   Other Topics Concern  . Not on file   Social History Narrative  . No narrative on file    Past Surgical History  Procedure Laterality Date  . Knee arthroscopy      left  . Hydrocele excision      93-94    Family History  Problem Relation Age of Onset  . Kidney disease Father   . Hypertension Other   . Cancer Paternal Grandmother     Lung    Allergies  Allergen Reactions  . Duracillin As [Penicillin G Procaine]     GI upset    Current Outpatient Prescriptions on File Prior to Visit  Medication Sig Dispense Refill  . aspirin 81 MG EC tablet Take 81 mg by mouth daily.        . cetirizine (ZYRTEC) 10 MG tablet Take 10 mg by mouth daily.        Marland Kitchen doxazosin (CARDURA) 8 MG tablet Take 1 tablet (  8 mg total) by mouth at bedtime.  90 tablet  3  . fluticasone (FLONASE) 50 MCG/ACT nasal spray Place 1 spray into the nose daily. As needed for additional allergy relief.  16 g  11  . hyoscyamine (LEVSIN SL) 0.125 MG SL tablet Place 1 tablet (0.125 mg total) under the tongue every 4 (four) hours as needed for cramping.  60 tablet  3  . lisinopril (PRINIVIL,ZESTRIL) 10 MG tablet Take 1 tablet (10 mg total) by mouth daily.  90 tablet  3  . LORazepam (ATIVAN) 1 MG tablet One half to one tablet one hour prior to flying  20 tablet  1  . promethazine (PHENERGAN) 25 MG  tablet Take 1 tablet (25 mg total) by mouth every 8 (eight) hours as needed for nausea.  20 tablet  0  . sertraline (ZOLOFT) 50 MG tablet Take 1 tablet (50 mg total) by mouth daily.  100 tablet  3  . simvastatin (ZOCOR) 40 MG tablet TAKE 1 TABLET (40 MG TOTAL) BY MOUTH AT BEDTIME.  90 tablet  3   No current facility-administered medications on file prior to visit.    EXAM: BP 128/80  Pulse 96  Temp(Src) 98.4 F (36.9 C) (Oral)  Resp 18  Wt 282 lb (127.914 kg)  SpO2 97%     Objective:   Physical Exam  Nursing note and vitals reviewed. Constitutional: He is oriented to person, place, and time. He appears well-developed and well-nourished. No distress.  HENT:  Head: Normocephalic and atraumatic.  Right Ear: External ear normal.  Left Ear: External ear normal.  Nose: Nose normal.  Mouth/Throat: No oropharyngeal exudate.  Oropharynx is slightly erythematous, no exudate. Bilateral TMs normal. Bilateral frontal and maxillary sinuses non-TTP.  Eyes: Conjunctivae and EOM are normal. Pupils are equal, round, and reactive to light.  Neck: Normal range of motion. Neck supple.  Cardiovascular: Normal rate, regular rhythm and intact distal pulses.   Pulmonary/Chest: Effort normal. No stridor. No respiratory distress. He has wheezes (mild). He has no rales. He exhibits no tenderness.  Lymphadenopathy:    He has no cervical adenopathy.  Neurological: He is alert and oriented to person, place, and time.  Skin: Skin is warm and dry. No rash (no rash visible on back.) noted. He is not diaphoretic. No erythema. No pallor.  Psychiatric: He has a normal mood and affect. His behavior is normal. Judgment and thought content normal.     Lab Results  Component Value Date   WBC 6.1 12/24/2013   HGB 14.9 12/24/2013   HCT 45.1 12/24/2013   PLT 312.0 12/24/2013   GLUCOSE 84 12/24/2013   CHOL 153 12/24/2013   TRIG 124.0 12/24/2013   HDL 38.60* 12/24/2013   LDLCALC 90 12/24/2013   ALT 33 12/24/2013   AST 24  12/24/2013   NA 139 12/24/2013   K 4.5 12/24/2013   CL 105 12/24/2013   CREATININE 1.0 12/24/2013   BUN 17 12/24/2013   CO2 29 12/24/2013   TSH 1.35 12/24/2013        Assessment & Plan:  Jim Goodwin was seen today for cough.  Diagnoses and associated orders for this visit:  Acute upper respiratory infections of unspecified site Comments: only 1 day history. Will treat symptomatically. Continue allergy medications. Add mucinex, rest, fluid hydration, watchful waiting.  Wheezing Comments: Mild. Possibly allergic exacerbated by URI. Treat with prednisone taper. - predniSONE (DELTASONE) 10 MG tablet; 3 tablets twice daily for 2 days, then 2 tablets twice daily for  2 days,  then 1 tablet twice daily for 2 days, then one tablet daily  for 6 days    Return precautions provided, and patient handout on URI and wheezing.  Plan to follow up as needed, or for worsening or persistent symptoms despite treatment.  Patient Instructions  Prednisone taper as directed to treat wheezing symptoms. Make sure to take each dose with a meal to prevent nausea.  Plain Over the Counter Mucinex (NOT Mucinex D) for thick secretions  Over-the-counter Robitussin-DM as needed for cough symptoms.  Force NON dairy fluids, drinking plenty of water is best.    Over the Counter Flonase OR Nasacort AQ 1 spray in each nostril twice a day as needed. Use the "crossover" technique into opposite nostril spraying toward opposite ear @ 45 degree angle, not straight up into nostril.   Plain Over the Counter Allegra (NOT D )  160 daily , OR Loratidine 10 mg , OR Zyrtec 10 mg @ bedtime  as needed for itchy eyes & sneezing.  Saline Irrigation and Saline Sprays can also help reduce symptoms.  If emergency symptoms discussed during visit developed, seek medical attention immediately.  Followup as needed, or for worsening or persistent symptoms despite treatment.

## 2014-03-15 NOTE — Patient Instructions (Addendum)
Prednisone taper as directed to treat wheezing symptoms. Make sure to take each dose with a meal to prevent nausea.  Plain Over the Counter Mucinex (NOT Mucinex D) for thick secretions  Over-the-counter Robitussin-DM as needed for cough symptoms.  Force NON dairy fluids, drinking plenty of water is best.    Over the Counter Flonase OR Nasacort AQ 1 spray in each nostril twice a day as needed. Use the "crossover" technique into opposite nostril spraying toward opposite ear @ 45 degree angle, not straight up into nostril.   Plain Over the Counter Allegra (NOT D )  160 daily , OR Loratidine 10 mg , OR Zyrtec 10 mg @ bedtime  as needed for itchy eyes & sneezing.  Saline Irrigation and Saline Sprays can also help reduce symptoms.  If emergency symptoms discussed during visit developed, seek medical attention immediately.  Followup as needed, or for worsening or persistent symptoms despite treatment.    Upper Respiratory Infection, Adult An upper respiratory infection (URI) is also known as the common cold. It is often caused by a type of germ (virus). Colds are easily spread (contagious). You can pass it to others by kissing, coughing, sneezing, or drinking out of the same glass. Usually, you get better in 1 or 2 weeks.  HOME CARE   Only take medicine as told by your doctor.  Use a warm mist humidifier or breathe in steam from a hot shower.  Drink enough water and fluids to keep your pee (urine) clear or pale yellow.  Get plenty of rest.  Return to work when your temperature is back to normal or as told by your doctor. You may use a face mask and wash your hands to stop your cold from spreading. GET HELP RIGHT AWAY IF:   After the first few days, you feel you are getting worse.  You have questions about your medicine.  You have chills, shortness of breath, or brown or red spit (mucus).  You have yellow or brown snot (nasal discharge) or pain in the face, especially when you bend  forward.  You have a fever, puffy (swollen) neck, pain when you swallow, or white spots in the back of your throat.  You have a bad headache, ear pain, sinus pain, or chest pain.  You have a high-pitched whistling sound when you breathe in and out (wheezing).  You have a lasting cough or cough up blood.  You have sore muscles or a stiff neck. MAKE SURE YOU:   Understand these instructions.  Will watch your condition.  Will get help right away if you are not doing well or get worse. Document Released: 12/26/2007 Document Revised: 10/01/2011 Document Reviewed: 10/14/2013 [facility] Patient Information 2015 [place], [place]. This information is not intended to replace advice given to you by your health care provider. Make sure you discuss any questions you have with your health care provider. Bronchospasm A bronchospasm is when the tubes that carry air in and out of your lungs (airways) spasm or tighten. During a bronchospasm it is hard to breathe. This is because the airways get smaller. A bronchospasm can be triggered by:  Allergies. These may be to animals, pollen, food, or mold.  Infection. This is a common cause of bronchospasm.  Exercise.  Irritants. These include pollution, cigarette smoke, strong odors, aerosol sprays, and paint fumes.  Weather changes.  Stress.  Being emotional. HOME CARE   Always have a plan for getting help. Know when to call your doctor and local  emergency services (911 in the [place] Know where you can get emergency care.  Only take medicines as told by your doctor.  If you were prescribed an inhaler or nebulizer machine, ask your doctor how to use it correctly. Always use a spacer with your inhaler if you were given one.  Stay calm during an attack. Try to relax and breathe more slowly.  Control your home environment:  Change your heating and air conditioning filter at least once a month.  Limit your use of fireplaces and wood stoves.  Do  not  smoke. Do not  allow smoking in your home.  Avoid perfumes and fragrances.  Get rid of pests (such as roaches and mice) and their droppings.  Throw away plants if you see mold on them.  Keep your house clean and dust free.  Replace carpet with wood, tile, or vinyl flooring. Carpet can trap dander and dust.  Use allergy-proof pillows, mattress covers, and box spring covers.  Wash bed sheets and blankets every week in hot water. Dry them in a dryer.  Use blankets that are made of polyester or cotton.  Wash hands frequently. GET HELP IF:  You have muscle aches.  You have chest pain.  The thick spit you spit or cough up (sputum) changes from clear or white to yellow, green, gray, or bloody.  The thick spit you spit or cough up gets thicker.  There are problems that may be related to the medicine you are given such as:  A rash.  Itching.  Swelling.  Trouble breathing. GET HELP RIGHT AWAY IF:  You feel you cannot breathe or catch your breath.  You cannot stop coughing.  Your treatment is not helping you breathe better.  You have very bad chest pain. MAKE SURE YOU:   Understand these instructions.  Will watch your condition.  Will get help right away if you are not doing well or get worse. Document Released: 05/06/2009 Document Revised: 07/14/2013 Document Reviewed: 12/30/2012 [facility] Patient Information 2015 [place], [place]. This information is not intended to replace advice given to you by your health care provider. Make sure you discuss any questions you have with your health care provider.

## 2014-03-15 NOTE — Progress Notes (Signed)
Pre visit review using our clinic review tool, if applicable. No additional management support is needed unless otherwise documented below in the visit note. 

## 2014-04-08 ENCOUNTER — Other Ambulatory Visit: Payer: Self-pay | Admitting: Family Medicine

## 2014-11-12 ENCOUNTER — Other Ambulatory Visit: Payer: Self-pay | Admitting: Family Medicine

## 2014-11-14 ENCOUNTER — Other Ambulatory Visit: Payer: Self-pay | Admitting: Family Medicine

## 2015-01-09 ENCOUNTER — Other Ambulatory Visit: Payer: Self-pay | Admitting: Family Medicine

## 2015-02-23 ENCOUNTER — Other Ambulatory Visit: Payer: Self-pay | Admitting: Family Medicine

## 2015-04-19 ENCOUNTER — Other Ambulatory Visit (INDEPENDENT_AMBULATORY_CARE_PROVIDER_SITE_OTHER): Payer: BLUE CROSS/BLUE SHIELD

## 2015-04-19 DIAGNOSIS — Z0000 Encounter for general adult medical examination without abnormal findings: Secondary | ICD-10-CM

## 2015-04-19 LAB — POCT URINALYSIS DIPSTICK
Bilirubin, UA: NEGATIVE
Glucose, UA: NEGATIVE
KETONES UA: NEGATIVE
LEUKOCYTES UA: NEGATIVE
NITRITE UA: NEGATIVE
PH UA: 6
Protein, UA: NEGATIVE
RBC UA: NEGATIVE
Spec Grav, UA: 1.025
Urobilinogen, UA: 0.2

## 2015-04-19 LAB — CBC WITH DIFFERENTIAL/PLATELET
BASOS ABS: 0 10*3/uL (ref 0.0–0.1)
BASOS PCT: 0.7 % (ref 0.0–3.0)
Eosinophils Absolute: 0.3 10*3/uL (ref 0.0–0.7)
Eosinophils Relative: 5.1 % — ABNORMAL HIGH (ref 0.0–5.0)
HEMATOCRIT: 45.7 % (ref 39.0–52.0)
Hemoglobin: 15.3 g/dL (ref 13.0–17.0)
LYMPHS PCT: 39.2 % (ref 12.0–46.0)
Lymphs Abs: 2.3 10*3/uL (ref 0.7–4.0)
MCHC: 33.4 g/dL (ref 30.0–36.0)
MCV: 89.3 fl (ref 78.0–100.0)
Monocytes Absolute: 0.4 10*3/uL (ref 0.1–1.0)
Monocytes Relative: 7 % (ref 3.0–12.0)
NEUTROS ABS: 2.8 10*3/uL (ref 1.4–7.7)
Neutrophils Relative %: 48 % (ref 43.0–77.0)
PLATELETS: 306 10*3/uL (ref 150.0–400.0)
RBC: 5.12 Mil/uL (ref 4.22–5.81)
RDW: 13.1 % (ref 11.5–15.5)
WBC: 5.8 10*3/uL (ref 4.0–10.5)

## 2015-04-19 LAB — BASIC METABOLIC PANEL
BUN: 12 mg/dL (ref 6–23)
CO2: 27 mEq/L (ref 19–32)
CREATININE: 0.95 mg/dL (ref 0.40–1.50)
Calcium: 9.1 mg/dL (ref 8.4–10.5)
Chloride: 106 mEq/L (ref 96–112)
GFR: 93.8 mL/min (ref >60.00)
Glucose, Bld: 89 mg/dL (ref 70–99)
POTASSIUM: 4.2 meq/L (ref 3.5–5.1)
Sodium: 141 mEq/L (ref 135–145)

## 2015-04-19 LAB — LIPID PANEL
CHOLESTEROL: 155 mg/dL (ref 0–200)
HDL: 42.4 mg/dL (ref >39.00)
LDL Cholesterol: 96 mg/dL (ref 0–99)
NonHDL: 112.38
TRIGLYCERIDES: 84 mg/dL (ref 0.0–149.0)
Total CHOL/HDL Ratio: 4
VLDL: 16.8 mg/dL (ref 0.0–40.0)

## 2015-04-19 LAB — HEPATIC FUNCTION PANEL
ALT: 28 U/L (ref 0–53)
AST: 21 U/L (ref 0–37)
Albumin: 4.1 g/dL (ref 3.5–5.2)
Alkaline Phosphatase: 43 U/L (ref 39–117)
BILIRUBIN DIRECT: 0.1 mg/dL (ref 0.0–0.3)
BILIRUBIN TOTAL: 0.5 mg/dL (ref 0.2–1.2)
Total Protein: 6.8 g/dL (ref 6.0–8.3)

## 2015-04-19 LAB — TSH: TSH: 1.28 u[IU]/mL (ref 0.35–4.50)

## 2015-04-26 ENCOUNTER — Encounter: Payer: Self-pay | Admitting: Family Medicine

## 2015-04-26 ENCOUNTER — Ambulatory Visit (INDEPENDENT_AMBULATORY_CARE_PROVIDER_SITE_OTHER): Payer: BLUE CROSS/BLUE SHIELD | Admitting: Family Medicine

## 2015-04-26 VITALS — BP 120/84 | Temp 98.1°F | Ht 70.5 in | Wt 295.0 lb

## 2015-04-26 DIAGNOSIS — I10 Essential (primary) hypertension: Secondary | ICD-10-CM

## 2015-04-26 DIAGNOSIS — F4010 Social phobia, unspecified: Secondary | ICD-10-CM | POA: Diagnosis not present

## 2015-04-26 DIAGNOSIS — E785 Hyperlipidemia, unspecified: Secondary | ICD-10-CM | POA: Diagnosis not present

## 2015-04-26 DIAGNOSIS — Z0000 Encounter for general adult medical examination without abnormal findings: Secondary | ICD-10-CM

## 2015-04-26 DIAGNOSIS — J302 Other seasonal allergic rhinitis: Secondary | ICD-10-CM

## 2015-04-26 DIAGNOSIS — Z23 Encounter for immunization: Secondary | ICD-10-CM

## 2015-04-26 DIAGNOSIS — E669 Obesity, unspecified: Secondary | ICD-10-CM

## 2015-04-26 MED ORDER — SERTRALINE HCL 50 MG PO TABS: ORAL_TABLET | ORAL | Status: DC

## 2015-04-26 MED ORDER — FLUTICASONE PROPIONATE 50 MCG/ACT NA SUSP: 1.0000 | Freq: Every day | NASAL | Status: DC

## 2015-04-26 MED ORDER — SIMVASTATIN 40 MG PO TABS: ORAL_TABLET | ORAL | Status: DC

## 2015-04-26 MED ORDER — LORAZEPAM 1 MG PO TABS: ORAL_TABLET | ORAL | Status: DC

## 2015-04-26 MED ORDER — LISINOPRIL 10 MG PO TABS: ORAL_TABLET | ORAL | Status: DC

## 2015-04-26 MED ORDER — DOXAZOSIN MESYLATE 8 MG PO TABS: 8.0000 mg | ORAL_TABLET | Freq: Every day | ORAL | Status: DC

## 2015-04-26 NOTE — Progress Notes (Signed)
   Subjective:    Patient ID: Jim Goodwin, male    DOB: [birth year], 39 y.o.   MRN: [id number]  HPI Jim Goodwin is a 39 year old married male nonsmoker who comes in today for general physical examination because of a history of hypertension, allergic rhinitis, depression, and hyperlipidemia, and obesity  His weight is gone up from 282 pounds to 295 pounds. His BMI is 40.30.  I discussed with him in detail diet exercise and weight loss clinic. I stressed he needed help and recommended the [facility]. He declines.  Med list reviewed and it was correct  He gets routine eye care, dental care, colonoscopy at age 45. No family history of colon cancer polyps.  Tetanus booster 2007 seasonal flu shot given today.   Review of Systems  Constitutional: Negative.   HENT: Negative.   Eyes: Negative.   Respiratory: Negative.   Cardiovascular: Negative.   Gastrointestinal: Negative.   Endocrine: Negative.   Genitourinary: Negative.   Musculoskeletal: Negative.   Skin: Negative.   Allergic/Immunologic: Negative.   Neurological: Negative.   Hematological: Negative.   Psychiatric/Behavioral: Negative.        Objective:   Physical Exam  Constitutional: He is oriented to person, place, and time. He appears well-developed and well-nourished.  HENT:  Head: Normocephalic and atraumatic.  Right Ear: External ear normal.  Left Ear: External ear normal.  Nose: Nose normal.  Mouth/Throat: Oropharynx is clear and moist.  Eyes: Conjunctivae and EOM are normal. Pupils are equal, round, and reactive to light.  Neck: Normal range of motion. Neck supple. No JVD present. No tracheal deviation present. No thyromegaly present.  Cardiovascular: Normal rate, regular rhythm, normal heart sounds and intact distal pulses.  Exam reveals no gallop and no friction rub.   No murmur heard. No carotid nor aortic bruits peripheral pulses 2+ and symmetrical  Pulmonary/Chest: Effort normal and breath sounds  normal. No stridor. No respiratory distress. He has no wheezes. He has no rales. He exhibits no tenderness.  Abdominal: Soft. Bowel sounds are normal. He exhibits no distension and no mass. There is no tenderness. There is no rebound and no guarding.  Musculoskeletal: Normal range of motion. He exhibits no edema or tenderness.  Lymphadenopathy:    He has no cervical adenopathy.  Neurological: He is alert and oriented to person, place, and time. He has normal reflexes. No cranial nerve deficit. He exhibits normal muscle tone.  Skin: Skin is warm and dry. No rash noted. No erythema. No pallor.  Psychiatric: He has a normal mood and affect. His behavior is normal. Judgment and thought content normal.  Nursing note and vitals reviewed.         Assessment & Plan:  Obesity,,,,,,, again stressed the importance of diet exercise and weight loss,,,,, recommended the nutrition center,,,,,,,,, patient declines  Hypertension at goal,,,,,,,,, continue current therapy  Hyperlipidemia at goal,,,,,,, continue current therapy  History of depression,,,,,,,,, continue Zoloft  Social anxiety fear flying,,,,, Ativan before flying  Allergic rhinitis continue Zyrtec and steroid nasal spray

## 2015-04-26 NOTE — Patient Instructions (Addendum)
Continue current medications  Focus on diet exercise and weight loss.........Marland Kitchen begin a walking program 20 minutes daily for 1 month then 30 minutes........... cut out all the carbohydrates  Weight yourself weekly  Return in one year for reevaluation sooner if any problems  [employer]......Marland Kitchen our new adult nurse practitioner from [facility]

## 2015-04-26 NOTE — Progress Notes (Signed)
Pre visit review using our clinic review tool, if applicable. No additional management support is needed unless otherwise documented below in the visit note. 

## 2015-06-25 ENCOUNTER — Other Ambulatory Visit: Payer: Self-pay | Admitting: Family Medicine

## 2015-08-11 ENCOUNTER — Other Ambulatory Visit: Payer: Self-pay | Admitting: Family Medicine

## 2015-08-26 ENCOUNTER — Encounter: Payer: Self-pay | Admitting: Family Medicine

## 2015-08-26 ENCOUNTER — Ambulatory Visit (INDEPENDENT_AMBULATORY_CARE_PROVIDER_SITE_OTHER): Payer: BLUE CROSS/BLUE SHIELD | Admitting: Family Medicine

## 2015-08-26 VITALS — BP 138/90 | HR 96 | Temp 98.2°F | Ht 70.5 in | Wt 298.9 lb

## 2015-08-26 DIAGNOSIS — M2550 Pain in unspecified joint: Secondary | ICD-10-CM | POA: Diagnosis not present

## 2015-08-26 DIAGNOSIS — R21 Rash and other nonspecific skin eruption: Secondary | ICD-10-CM | POA: Diagnosis not present

## 2015-08-26 NOTE — Progress Notes (Signed)
Pre visit review using our clinic review tool, if applicable. No additional management support is needed unless otherwise documented below in the visit note. 

## 2015-08-26 NOTE — Patient Instructions (Signed)

## 2015-08-26 NOTE — Progress Notes (Signed)
Subjective:    Patient ID: Jim Goodwin, male    DOB: 24-Feb-1976, 40 y.o.   MRN: [id number]  HPI Patient seems a work in with complaint of polyarthralgias and left leg rash which occur this week. He describes 2 days ago having some chills without documented fever. He had joint aches involving his knees, hands, wrists, shoulders, hips. He did not see any visible swelling or redness or warmth. No sore throat. He states he is essentially 90% better today  Yesterday he noticed what he described as a "splotchy "red rash involving his left leg. By this morning that had premature resolved. He did not have any associated itching or pain. He did notice that the left leg was slightly more warm to touch than the right. He has not had any calf edema or tenderness and denied any asymmetric edema.  No recent sick contacts. No lymphadenopathy. No cough. Denies recent sinus congestion. His chronic problems include hypertension, hyperlipidemia, obesity, social anxiety disorder, and history of kidney stones  Past Medical History  Diagnosis Date  . Allergy   . Hypertension   . Hyperlipidemia   . Arthritis   . Nephrolithiasis    Past Surgical History  Procedure Laterality Date  . Knee arthroscopy      left  . Hydrocele excision      93-94    reports that he quit smoking about 10 years ago. He has quit using smokeless tobacco. He reports that he drinks about 1.2 oz of alcohol per week. He reports that he does not use illicit drugs. family history includes Cancer in his paternal grandmother; Hypertension in his other; Kidney disease in his father. Allergies  Allergen Reactions  . Duracillin As [Penicillin G Procaine]     GI upset      Review of Systems  Constitutional: Negative for fever, chills, appetite change, fatigue and unexpected weight change.  HENT: Negative for sore throat.   Respiratory: Negative for cough and shortness of breath.   Cardiovascular: Negative for chest pain and  palpitations.  Gastrointestinal: Negative for nausea, vomiting, abdominal pain and blood in stool.  Genitourinary: Negative for dysuria and hematuria.  Neurological: Negative for headaches.  Hematological: Negative for adenopathy. Does not bruise/bleed easily.       Objective:   Physical Exam  Constitutional: He appears well-developed and well-nourished.  HENT:  Mouth/Throat: Oropharynx is clear and moist.  Neck: Neck supple. No thyromegaly present.  Cardiovascular: Normal rate and regular rhythm.  Exam reveals no gallop.   No murmur heard. Pulmonary/Chest: Effort normal and breath sounds normal. No respiratory distress. He has no wheezes. He has no rales.  Abdominal: Soft. He exhibits no mass. There is no tenderness. There is no rebound and no guarding.  No hepatomegaly or splenomegaly  Musculoskeletal:  He has only very minimal trace edema both legs. He states this is chronic and unchanged  Lymphadenopathy:    He has no cervical adenopathy.  Skin: No rash noted.          Assessment & Plan:  Patient resents with acute onset of polyarthralgias and left leg rash which was only one day duration. We explained this would not be typical for cellulitis to resolve this quickly. Differential would include allergic rash or possibly vasculitic type process. He showed pictures of rash yesterday and was dramatically more splotchy and red yesterday but fully resolved today. Question recent viral syndrome. Since he is about 90% improved today we elected not to get any labs this  point. Follow-up promptly for any recurrent arthralgias, rash, or other concerns

## 2015-10-25 ENCOUNTER — Other Ambulatory Visit: Payer: Self-pay | Admitting: Family Medicine

## 2016-02-20 ENCOUNTER — Other Ambulatory Visit: Payer: Self-pay | Admitting: Family Medicine

## 2016-03-20 ENCOUNTER — Other Ambulatory Visit: Payer: Self-pay | Admitting: Family Medicine

## 2016-05-26 DIAGNOSIS — Z23 Encounter for immunization: Secondary | ICD-10-CM | POA: Diagnosis not present

## 2016-05-30 ENCOUNTER — Other Ambulatory Visit: Payer: Self-pay | Admitting: Family Medicine

## 2016-06-19 ENCOUNTER — Other Ambulatory Visit: Payer: Self-pay | Admitting: Family Medicine

## 2016-07-31 ENCOUNTER — Other Ambulatory Visit (INDEPENDENT_AMBULATORY_CARE_PROVIDER_SITE_OTHER): Payer: BLUE CROSS/BLUE SHIELD

## 2016-07-31 DIAGNOSIS — Z0000 Encounter for general adult medical examination without abnormal findings: Secondary | ICD-10-CM | POA: Diagnosis not present

## 2016-07-31 LAB — TSH: TSH: 2.29 u[IU]/mL (ref 0.35–4.50)

## 2016-07-31 LAB — POC URINALSYSI DIPSTICK (AUTOMATED)
Bilirubin, UA: NEGATIVE
Glucose, UA: NEGATIVE
KETONES UA: NEGATIVE
Leukocytes, UA: NEGATIVE
Nitrite, UA: NEGATIVE
RBC UA: NEGATIVE
SPEC GRAV UA: 1.015
UROBILINOGEN UA: 0.2
pH, UA: 7

## 2016-07-31 LAB — HEPATIC FUNCTION PANEL
ALK PHOS: 47 U/L (ref 39–117)
ALT: 27 U/L (ref 0–53)
AST: 20 U/L (ref 0–37)
Albumin: 4.3 g/dL (ref 3.5–5.2)
BILIRUBIN DIRECT: 0.1 mg/dL (ref 0.0–0.3)
Total Bilirubin: 0.5 mg/dL (ref 0.2–1.2)
Total Protein: 6.8 g/dL (ref 6.0–8.3)

## 2016-07-31 LAB — CBC WITH DIFFERENTIAL/PLATELET
BASOS ABS: 0.1 10*3/uL (ref 0.0–0.1)
BASOS PCT: 0.9 % (ref 0.0–3.0)
EOS ABS: 0.2 10*3/uL (ref 0.0–0.7)
Eosinophils Relative: 3.6 % (ref 0.0–5.0)
HCT: 44.4 % (ref 39.0–52.0)
HEMOGLOBIN: 15.1 g/dL (ref 13.0–17.0)
LYMPHS PCT: 35.7 % (ref 12.0–46.0)
Lymphs Abs: 2.2 10*3/uL (ref 0.7–4.0)
MCHC: 34.1 g/dL (ref 30.0–36.0)
MCV: 87.5 fl (ref 78.0–100.0)
Monocytes Absolute: 0.4 10*3/uL (ref 0.1–1.0)
Monocytes Relative: 6.1 % (ref 3.0–12.0)
Neutro Abs: 3.4 10*3/uL (ref 1.4–7.7)
Neutrophils Relative %: 53.7 % (ref 43.0–77.0)
Platelets: 297 10*3/uL (ref 150.0–400.0)
RBC: 5.07 Mil/uL (ref 4.22–5.81)
RDW: 12.9 % (ref 11.5–15.5)
WBC: 6.3 10*3/uL (ref 4.0–10.5)

## 2016-07-31 LAB — BASIC METABOLIC PANEL
BUN: 13 mg/dL (ref 6–23)
CHLORIDE: 105 meq/L (ref 96–112)
CO2: 26 mEq/L (ref 19–32)
CREATININE: 0.88 mg/dL (ref 0.40–1.50)
Calcium: 9.1 mg/dL (ref 8.4–10.5)
GFR: 101.79 mL/min (ref >60.00)
Glucose, Bld: 85 mg/dL (ref 70–99)
Potassium: 4.3 mEq/L (ref 3.5–5.1)
Sodium: 139 mEq/L (ref 135–145)

## 2016-07-31 LAB — LIPID PANEL
Cholesterol: 161 mg/dL (ref 0–200)
HDL: 44.4 mg/dL (ref >39.00)
LDL Cholesterol: 96 mg/dL (ref 0–99)
NONHDL: 116.32
Total CHOL/HDL Ratio: 4
Triglycerides: 104 mg/dL (ref 0.0–149.0)
VLDL: 20.8 mg/dL (ref 0.0–40.0)

## 2016-08-02 ENCOUNTER — Other Ambulatory Visit: Payer: BLUE CROSS/BLUE SHIELD

## 2016-08-07 ENCOUNTER — Encounter: Payer: Self-pay | Admitting: Family Medicine

## 2016-08-07 ENCOUNTER — Ambulatory Visit (INDEPENDENT_AMBULATORY_CARE_PROVIDER_SITE_OTHER): Payer: BLUE CROSS/BLUE SHIELD | Admitting: Family Medicine

## 2016-08-07 VITALS — BP 140/90 | HR 98 | Temp 98.1°F | Ht 70.5 in | Wt 306.3 lb

## 2016-08-07 DIAGNOSIS — J309 Allergic rhinitis, unspecified: Secondary | ICD-10-CM

## 2016-08-07 DIAGNOSIS — Z6841 Body Mass Index (BMI) 40.0 and over, adult: Secondary | ICD-10-CM

## 2016-08-07 DIAGNOSIS — E669 Obesity, unspecified: Secondary | ICD-10-CM | POA: Diagnosis not present

## 2016-08-07 DIAGNOSIS — F4010 Social phobia, unspecified: Secondary | ICD-10-CM

## 2016-08-07 DIAGNOSIS — E785 Hyperlipidemia, unspecified: Secondary | ICD-10-CM | POA: Diagnosis not present

## 2016-08-07 DIAGNOSIS — I10 Essential (primary) hypertension: Secondary | ICD-10-CM | POA: Diagnosis not present

## 2016-08-07 MED ORDER — LISINOPRIL 20 MG PO TABS: 20.0000 mg | ORAL_TABLET | Freq: Every day | ORAL | 3 refills | Status: DC

## 2016-08-07 MED ORDER — SIMVASTATIN 40 MG PO TABS: ORAL_TABLET | ORAL | 4 refills | Status: DC

## 2016-08-07 MED ORDER — DOXAZOSIN MESYLATE 8 MG PO TABS: ORAL_TABLET | ORAL | 4 refills | Status: DC

## 2016-08-07 MED ORDER — LORAZEPAM 1 MG PO TABS: ORAL_TABLET | ORAL | 1 refills | Status: DC

## 2016-08-07 MED ORDER — SERTRALINE HCL 50 MG PO TABS: ORAL_TABLET | ORAL | 4 refills | Status: DC

## 2016-08-07 NOTE — Progress Notes (Signed)
Jim Goodwin is a 41 year old married , allergic rhinitis, mild depression, obesity, hyperlipidemia.  Weight last year was 299 POUNDS> Now his weight is 305 pounds Offered nutritional consult he declined  He takes Zyrtec and steroid nasal spray for allergic rhinitis  He takes 8 mg of Cardura and 10  Milligrams of lisinopril daily for hypertension. BP 140/90. We'll increase lisinopril to 20 m  He takes Ativan 1 mg as needed for flying  He takes Zoloft 50 mg at bedtime  He takes Zocor 40 mg along with an aspirin tablet for Vaccinations up-to-date  He gets routine eye  Care because his father has glauco. He gets regular dental care. N He says his mother is recently  Found to have a small bowel tumor  14 point review of systems reviewe  BP 140/90 (BP Location: Left Arm, Patient Position: Sitting, Cuff Size: Normal)   Pulse 98   Temp 98.1 F (36.7 C) (Oral)   Ht 5' 10.5" (1.791 m)   Wt (!) 306 lb 4.8 oz (138.9 kg)   BMI 43.33 kg/m  Examination HEENT were negative neck was supple thyroid is not enlarged no carotid bruits cardio point exam normal abdominal exam normal except for massive panniculus. Genitalia normal circumcised male. Extremities normal skin normal peripheral pulses normal  #1 obesity.......Marland Kitchen. referred to [facility]  #2 hypertension not at goal....... continue Cardura.... Increase lisinopril to 20 mg daily. Monitor blood pressure at home. Return in 4-6 weeks if blood pressures not at goal 130/80  #3 allergic rhinitis continue Zyrtec and steroid nasal spray  #4 history of mild depression continue Zoloft  #5 hyperlipidemia.....Marland Kitchen. continue Zocor and aspirin  History of mild anxiety related to flying.......Marland Kitchen. refill Ativan

## 2016-08-07 NOTE — Patient Instructions (Signed)
Increase the lisinopril to 20 mg a day. Continue the Cardura 8 mg daily  Check your blood pressure daily in the morning for 4-6 weeks. Blood pressure goal 130/80 or less,,,,,,,,,,,, is not at goal return for follow-up  Continue other medications  I would call [facility].  Follow-up in one year sooner if any problems

## 2016-08-13 ENCOUNTER — Telehealth: Payer: Self-pay | Admitting: Emergency Medicine

## 2016-08-13 NOTE — Telephone Encounter (Signed)
Called pt about prescription being ready for pickup. Pt states they already picked up rx from pharmacy on [address] for Ativan.

## 2016-08-21 ENCOUNTER — Encounter: Payer: Self-pay | Admitting: Family Medicine

## 2016-08-21 ENCOUNTER — Ambulatory Visit (INDEPENDENT_AMBULATORY_CARE_PROVIDER_SITE_OTHER): Payer: BLUE CROSS/BLUE SHIELD | Admitting: Family Medicine

## 2016-08-21 VITALS — BP 134/88 | HR 94 | Temp 98.2°F | Ht 70.5 in | Wt 303.4 lb

## 2016-08-21 DIAGNOSIS — J069 Acute upper respiratory infection, unspecified: Secondary | ICD-10-CM | POA: Diagnosis not present

## 2016-08-21 MED ORDER — DOXYCYCLINE HYCLATE 100 MG PO CAPS: 100.0000 mg | ORAL_CAPSULE | Freq: Two times a day (BID) | ORAL | 0 refills | Status: DC

## 2016-08-21 NOTE — Progress Notes (Signed)
Pre visit review using our clinic review tool, if applicable. No additional management support is needed unless otherwise documented below in the visit note. 

## 2016-08-21 NOTE — Progress Notes (Signed)
Subjective:     Patient ID: Jim Goodwin, male   DOB: 1976/04/21, 41 y.o.   MRN: [id number]  HPI Patient seen for respiratory illness. [place] last [place]. He relates about a 7 or 8 day history of sore throat, cough, intermittent headaches, increased malaise, maxillary facial pain. Cough productive of thick green sputum. No fevers or chills. No alleviating factors. Denies any nausea, vomiting, or diarrhea  Past Medical History:  Diagnosis Date  . Allergy   . Arthritis   . Hyperlipidemia   . Hypertension   . Nephrolithiasis    Past Surgical History:  Procedure Laterality Date  . HYDROCELE EXCISION     93-94  . KNEE ARTHROSCOPY     left    reports that he quit smoking about 11 years ago. He has quit using smokeless tobacco. He reports that he drinks about 1.2 oz of alcohol per week . He reports that he does not use drugs. family history includes Cancer in his paternal grandmother; Hypertension in his other; Kidney disease in his father. Allergies  Allergen Reactions  . Duracillin As [Penicillin G Procaine]     GI upset     Review of Systems  Constitutional: Positive for fatigue. Negative for chills and fever.  HENT: Positive for congestion, sinus pressure and sore throat. Negative for ear pain.   Respiratory: Positive for cough.   Neurological: Positive for headaches.       Objective:   Physical Exam  Constitutional: He appears well-developed and well-nourished.  HENT:  Right Ear: External ear normal.  Left Ear: External ear normal.  Mouth/Throat: Oropharynx is clear and moist. No oropharyngeal exudate.  Neck: Neck supple.  Cardiovascular: Normal rate and regular rhythm.   Pulmonary/Chest: Effort normal and breath sounds normal. No respiratory distress. He has no wheezes. He has no rales.  Lymphadenopathy:    He has no cervical adenopathy.       Assessment:     URI with cough. Possible evolving maxillary sinusitis    Plan:     -We  recommended good hydration and observe for another few days -If he develops any fever or worsening facial pain or headache consider doxycycline 100 mg twice daily for 10 days -Consider plain Mucinex twice daily  Kristian CoveyBruce W Burchette MD [facility]

## 2016-08-21 NOTE — Patient Instructions (Signed)

## 2016-08-25 ENCOUNTER — Other Ambulatory Visit: Payer: Self-pay | Admitting: Family Medicine

## 2016-09-16 ENCOUNTER — Other Ambulatory Visit: Payer: Self-pay | Admitting: Family Medicine

## 2016-09-18 NOTE — Telephone Encounter (Signed)
Denied. Medication was filled on 08/07/16 for one year.  Message sent to the pharmacy to check file.

## 2016-09-25 ENCOUNTER — Other Ambulatory Visit: Payer: Self-pay | Admitting: Family Medicine

## 2017-04-12 ENCOUNTER — Encounter: Payer: Self-pay | Admitting: Family Medicine

## 2017-07-12 ENCOUNTER — Encounter: Payer: Self-pay | Admitting: Family Medicine

## 2017-07-12 ENCOUNTER — Ambulatory Visit: Payer: BLUE CROSS/BLUE SHIELD | Admitting: Family Medicine

## 2017-07-12 VITALS — BP 110/70 | HR 112 | Temp 99.6°F | Wt 310.1 lb

## 2017-07-12 DIAGNOSIS — J209 Acute bronchitis, unspecified: Secondary | ICD-10-CM

## 2017-07-12 MED ORDER — HYDROCODONE-HOMATROPINE 5-1.5 MG/5ML PO SYRP: 5.0000 mL | ORAL_SOLUTION | Freq: Four times a day (QID) | ORAL | 0 refills | Status: AC | PRN

## 2017-07-12 NOTE — Progress Notes (Signed)
Subjective:     Patient ID: Jim Goodwin, male   DOB: [birth year], 41 y.o.   MRN: [id number]  HPI Patient seen with chief complaint of cough. He started having some nasal congestion about 8 days ago and that is gradually improving. He had some laryngitis which started last weekend and cough. Cough onset about 3-4 days ago. His cough is dry and severe at night. He tried some over-the-counter Robitussin-DM without improvement. Denies any fevers or chills. No dyspnea. No wheezing. Former smoker. No hemoptysis.  Past Medical History:  Diagnosis Date  . Allergy   . Arthritis   . Hyperlipidemia   . Hypertension   . Nephrolithiasis    Past Surgical History:  Procedure Laterality Date  . HYDROCELE EXCISION     93-94  . KNEE ARTHROSCOPY     left    reports that he quit smoking about 11 years ago. He has quit using smokeless tobacco. He reports that he drinks about 1.2 oz of alcohol per week. He reports that he does not use drugs. family history includes Cancer in his paternal grandmother; Hypertension in his other; Kidney disease in his father. Allergies  Allergen Reactions  . Duracillin As [Penicillin G Procaine]     GI upset     Review of Systems  Constitutional: Negative for chills and fever.  HENT: Positive for voice change.   Respiratory: Positive for cough. Negative for shortness of breath and wheezing.   Cardiovascular: Negative for chest pain.       Objective:   Physical Exam  Constitutional: He appears well-developed and well-nourished.  HENT:  Right Ear: External ear normal.  Left Ear: External ear normal.  Mouth/Throat: Oropharynx is clear and moist.  Neck: Neck supple.  Cardiovascular: Normal rate and regular rhythm.  Pulmonary/Chest: Effort normal and breath sounds normal. No respiratory distress. He has no wheezes. He has no rales.  Lymphadenopathy:    He has no cervical adenopathy.       Assessment:     Cough.   Suspect acute viral bronchitis.    Plan:      -Hycodan cough syrup 1 teaspoon daily at bedtime when necessary severe cough -Is aware cough related to bronchitis can last sometimes several weeks -Follow-up with primary if cough not resolving 3-4 weeks  Kristian CoveyBruce W Amiracle Neises MD [facility]

## 2017-07-12 NOTE — Patient Instructions (Signed)

## 2017-08-12 ENCOUNTER — Other Ambulatory Visit: Payer: Self-pay | Admitting: Family Medicine

## 2017-08-13 ENCOUNTER — Ambulatory Visit: Payer: BLUE CROSS/BLUE SHIELD | Admitting: Family Medicine

## 2017-08-13 ENCOUNTER — Encounter: Payer: Self-pay | Admitting: Family Medicine

## 2017-08-13 VITALS — BP 128/90 | HR 96 | Temp 97.9°F | Ht 70.0 in | Wt 308.0 lb

## 2017-08-13 DIAGNOSIS — E785 Hyperlipidemia, unspecified: Secondary | ICD-10-CM | POA: Diagnosis not present

## 2017-08-13 DIAGNOSIS — Z23 Encounter for immunization: Secondary | ICD-10-CM

## 2017-08-13 DIAGNOSIS — E669 Obesity, unspecified: Secondary | ICD-10-CM | POA: Diagnosis not present

## 2017-08-13 DIAGNOSIS — J302 Other seasonal allergic rhinitis: Secondary | ICD-10-CM

## 2017-08-13 DIAGNOSIS — I10 Essential (primary) hypertension: Secondary | ICD-10-CM

## 2017-08-13 DIAGNOSIS — Z0000 Encounter for general adult medical examination without abnormal findings: Secondary | ICD-10-CM | POA: Insufficient documentation

## 2017-08-13 DIAGNOSIS — F4010 Social phobia, unspecified: Secondary | ICD-10-CM

## 2017-08-13 LAB — HEPATIC FUNCTION PANEL
ALT: 25 U/L (ref 0–53)
AST: 19 U/L (ref 0–37)
Albumin: 4.3 g/dL (ref 3.5–5.2)
Alkaline Phosphatase: 47 U/L (ref 39–117)
BILIRUBIN TOTAL: 0.6 mg/dL (ref 0.2–1.2)
Bilirubin, Direct: 0.1 mg/dL (ref 0.0–0.3)
Total Protein: 6.8 g/dL (ref 6.0–8.3)

## 2017-08-13 LAB — POCT URINALYSIS DIPSTICK
BILIRUBIN UA: NEGATIVE
Blood, UA: NEGATIVE
GLUCOSE UA: NEGATIVE
Ketones, UA: NEGATIVE
Leukocytes, UA: NEGATIVE
Nitrite, UA: NEGATIVE
Protein, UA: NEGATIVE
Spec Grav, UA: 1.015 (ref 1.010–1.025)
Urobilinogen, UA: 0.2 E.U./dL
pH, UA: 7 (ref 5.0–8.0)

## 2017-08-13 LAB — CBC WITH DIFFERENTIAL/PLATELET
BASOS PCT: 0.9 % (ref 0.0–3.0)
Basophils Absolute: 0.1 10*3/uL (ref 0.0–0.1)
EOS PCT: 3.4 % (ref 0.0–5.0)
Eosinophils Absolute: 0.2 10*3/uL (ref 0.0–0.7)
HEMATOCRIT: 44.9 % (ref 39.0–52.0)
HEMOGLOBIN: 15 g/dL (ref 13.0–17.0)
LYMPHS PCT: 35.2 % (ref 12.0–46.0)
Lymphs Abs: 2.1 10*3/uL (ref 0.7–4.0)
MCHC: 33.3 g/dL (ref 30.0–36.0)
MCV: 89.2 fl (ref 78.0–100.0)
MONOS PCT: 6.5 % (ref 3.0–12.0)
Monocytes Absolute: 0.4 10*3/uL (ref 0.1–1.0)
Neutro Abs: 3.3 10*3/uL (ref 1.4–7.7)
Neutrophils Relative %: 54 % (ref 43.0–77.0)
Platelets: 302 10*3/uL (ref 150.0–400.0)
RBC: 5.04 Mil/uL (ref 4.22–5.81)
RDW: 13 % (ref 11.5–15.5)
WBC: 6.1 10*3/uL (ref 4.0–10.5)

## 2017-08-13 LAB — LIPID PANEL
CHOL/HDL RATIO: 4
Cholesterol: 154 mg/dL (ref 0–200)
HDL: 39.8 mg/dL (ref >39.00)
LDL Cholesterol: 91 mg/dL (ref 0–99)
NONHDL: 113.84
Triglycerides: 116 mg/dL (ref 0.0–149.0)
VLDL: 23.2 mg/dL (ref 0.0–40.0)

## 2017-08-13 LAB — BASIC METABOLIC PANEL
BUN: 14 mg/dL (ref 6–23)
CALCIUM: 9.2 mg/dL (ref 8.4–10.5)
CHLORIDE: 101 meq/L (ref 96–112)
CO2: 33 meq/L — AB (ref 19–32)
Creatinine, Ser: 0.9 mg/dL (ref 0.40–1.50)
GFR: 98.68 mL/min (ref >60.00)
Glucose, Bld: 83 mg/dL (ref 70–99)
Potassium: 4.3 mEq/L (ref 3.5–5.1)
SODIUM: 139 meq/L (ref 135–145)

## 2017-08-13 LAB — TSH: TSH: 1.47 u[IU]/mL (ref 0.35–4.50)

## 2017-08-13 LAB — HEMOGLOBIN A1C: HEMOGLOBIN A1C: 5.7 % (ref 4.6–6.5)

## 2017-08-13 MED ORDER — DOXAZOSIN MESYLATE 8 MG PO TABS: ORAL_TABLET | ORAL | 4 refills | Status: DC

## 2017-08-13 MED ORDER — SERTRALINE HCL 50 MG PO TABS: ORAL_TABLET | ORAL | 4 refills | Status: DC

## 2017-08-13 MED ORDER — SIMVASTATIN 40 MG PO TABS: ORAL_TABLET | ORAL | 4 refills | Status: DC

## 2017-08-13 MED ORDER — FLUTICASONE PROPIONATE 50 MCG/ACT NA SUSP: 1.0000 | Freq: Every day | NASAL | 6 refills | Status: AC

## 2017-08-13 NOTE — Progress Notes (Signed)
Jim Goodwin is a 42 year old married male nonsmoker who comes in today for annual exam because of a history of underlying hypertension and obesity  For hypertension he takes lisinopril 10 mg and Cardura 8 mg daily. BP at home 130/80  He takes Zocor 40 mg daily along with an aspirin tablet because of a history of hyperlipidemia  He uses Zyrtec and steroid nasal spray for allergic rhinitis  He takes Zoloft 50 mg at bedtime because of a history of social phobia  His weight is unchanged 308 pounds  Social history......Marland Kitchen. married [place] in [place] has a 42 year old an 42-year-old at home. 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 Therefore recommend he start getting screening eye exams now at age 42.  6614 point review of systems reviewed and otherwise negative  BP 128/90 (BP Location: Left Arm, Patient Position: Sitting, Cuff Size: Large)   Pulse 96   Temp 97.9 F (36.6 C) (Oral)   Ht 5\' 10"  (1.778 m)   Wt (!) 308 lb (139.7 kg)   BMI 44.19 kg/m  Well-developed well-nourished male no acute distress vital signs stable he is afebrile except for his weight  HEENT were negative neck was supple thyroid is not enlarged cardiopulmonary exam normal abdominal exam normal except for massive panniculus. Extremities normal skin normal peripheral pulses normal  #1 hypertension at goal,,,,,,,,, continue current therapy  #2 obesity,,,,,,,,,,,,, again instructed diet exercise and weight loss,,,,,,,,,,,, broached the subject of bariatric surgery  #3 history of hyperlipidemia,,,,,,,,,,, continue Zocor and aspirin  #4 history of social phobia............... continue Zoloft  #5 out allergic rhinitis....... continue Zyrtec 10 mg daily at bedtime Flomax steroid nasal spray

## 2017-08-13 NOTE — Patient Instructions (Signed)
Labs today........ I will call you if there is anything abnormal  Continue current medications  Carbohydrate free diet.......... walk 30 minutes daily.........  Because a family history of glaucoma recommended yearly eye exams  Follow-up in one year sooner if any problems

## 2017-08-29 ENCOUNTER — Other Ambulatory Visit: Payer: Self-pay | Admitting: Family Medicine

## 2017-09-26 ENCOUNTER — Other Ambulatory Visit: Payer: Self-pay | Admitting: Family Medicine

## 2017-10-26 ENCOUNTER — Encounter (HOSPITAL_COMMUNITY): Payer: Self-pay | Admitting: Emergency Medicine

## 2017-10-26 ENCOUNTER — Ambulatory Visit (HOSPITAL_COMMUNITY)
Admission: EM | Admit: 2017-10-26 | Discharge: 2017-10-26 | Disposition: A | Discharge status: Home / Self Care | Payer: BLUE CROSS/BLUE SHIELD | Attending: Internal Medicine | Admitting: Internal Medicine

## 2017-10-26 DIAGNOSIS — S61012A Laceration without foreign body of left thumb without damage to nail, initial encounter: Secondary | ICD-10-CM | POA: Diagnosis not present

## 2017-10-26 DIAGNOSIS — W268XXA Contact with other sharp object(s), not elsewhere classified, initial encounter: Secondary | ICD-10-CM

## 2017-10-26 NOTE — Discharge Instructions (Signed)
Return in 7-10 days for removal  Keep clean and dry for next 48-72 hours, wash daily with warm soapy water and dry well after

## 2017-10-26 NOTE — ED Triage Notes (Signed)
Provider discharge.

## 2017-10-26 NOTE — ED Provider Notes (Signed)
[facility]    CSN: [id number] Arrival date & time: 10/26/17  1938     History   Chief Complaint Chief Complaint  Patient presents with  . Laceration    HPI Jim Goodwin is a 41 y.o. male presenting today with laceration to his left thumb.  States he was cutting wood with a circular saw and accidentally cut his finger on the piece of wood.  Bleeding was well controlled.  Denies any numbness or tingling.  HPI  Past Medical History:  Diagnosis Date  . Allergy   . Arthritis   . Hyperlipidemia   . Hypertension   . Nephrolithiasis     Patient Active Problem List   Diagnosis Date Noted  . Routine general medical examination at a health care facility 08/13/2017  . Social anxiety disorder 03/26/2013  . Obesity (BMI 30-39.9) 03/26/2013  . Hyperlipidemia 01/23/2007  . Essential hypertension 01/23/2007  . Allergic rhinitis 01/23/2007    Past Surgical History:  Procedure Laterality Date  . HYDROCELE EXCISION     93-94  . KNEE ARTHROSCOPY     left       Home Medications    Prior to Admission medications   Medication Sig Start Date End Date Taking? Authorizing Provider  aspirin 81 MG EC tablet Take 81 mg by mouth daily.      [provider]  cetirizine (ZYRTEC) 10 MG tablet Take 10 mg by mouth daily.      [provider]  doxazosin (CARDURA) 8 MG tablet TAKE 1 TABLET AT BEDTIME (NEED APPOINTMENT WITH PRIMARY CARE PHYSICIAN FOR FURTHER REFILLS) 08/30/17   Roderick Pee, MD  fluticasone (FLONASE) 50 MCG/ACT nasal spray Place 1 spray into both nostrils daily. As needed for additional allergy relief. 08/13/17   Roderick Pee, MD  lisinopril (PRINIVIL,ZESTRIL) 10 MG tablet TAKE ONE TABLET BY MOUTH DAILY 09/26/17   Roderick Pee, MD  LORazepam (ATIVAN) 1 MG tablet One half to one tablet one hour prior to flying 08/07/16   Roderick Pee, MD  sertraline (ZOLOFT) 50 MG tablet TAKE ONE TABLET BY MOUTH DAILY 08/13/17   Roderick Pee, MD    simvastatin (ZOCOR) 40 MG tablet TAKE 1 TABLET (40 MG TOTAL) BY MOUTH AT BEDTIME. 08/13/17   Roderick Pee, MD    Family History Family History  Problem Relation Age of Onset  . Kidney disease Father   . Hypertension Other   . Cancer Paternal Grandmother        Lung    Social History Social History   Tobacco Use  . Smoking status: Former Smoker    Last attempt to quit: 07/23/2005    Years since quitting: 12.2  . Smokeless tobacco: Former Engineer, water Use Topics  . Alcohol use: Yes    Alcohol/week: 1.2 oz    Types: 1 Cans of beer, 1 Shots of liquor per week    Comment: maybe on a week  . Drug use: No     Allergies   Duracillin as [penicillin g procaine]   Review of Systems Review of Systems  Constitutional: Negative for fever.  Respiratory: Negative for shortness of breath.   Cardiovascular: Negative for chest pain.  Gastrointestinal: Negative for nausea and vomiting.  Musculoskeletal: Negative for myalgias.  Skin: Positive for wound. Negative for color change and pallor.  Neurological: Negative for dizziness, weakness, light-headedness and headaches.     Physical Exam Triage Vital Signs ED Triage Vitals [10/26/17 2004]  Enc Vitals Group     BP (!) 142/87     Pulse Rate 96     Resp 18     Temp 98.1 F (36.7 C)     Temp Source Oral     SpO2 97 %     Weight      Height      Head Circumference      Peak Flow      Pain Score      Pain Loc      Pain Edu?      Excl. in GC?    No data found.  Updated Vital Signs BP (!) 142/87 (BP Location: Right Arm)   Pulse 96   Temp 98.1 F (36.7 C) (Oral)   Resp 18   SpO2 97%   Visual Acuity Right Eye Distance:   Left Eye Distance:   Bilateral Distance:    Right Eye Near:   Left Eye Near:    Bilateral Near:     Physical Exam  Constitutional: He appears well-developed and well-nourished.  HENT:  Head: Normocephalic and atraumatic.  Eyes: Conjunctivae are normal.  Neck: Neck supple.   Cardiovascular: Normal rate.  Pulmonary/Chest: Effort normal. No respiratory distress.  Musculoskeletal: He exhibits no edema.  Neurological: He is alert.  Skin: Skin is warm and dry.  Left hand: Small0.5 centimeter laceration to posterior aspect to proximal base of thumb.  No tendon damage, skin is slightly gaping  Psychiatric: He has a normal mood and affect.  Nursing note and vitals reviewed.    UC Treatments / Results  Labs (all labs ordered are listed, but only abnormal results are displayed) Labs Reviewed - No data to display  EKG None Radiology No results found.  Procedures Laceration Repair Date/Time: 10/26/2017 8:36 PM Performed by: Myrna Vonseggern, Junius CreamerHallie C, PA-C Authorized by: Isa RankinMurray, Laura Wilson, MD   Consent:    Consent obtained:  Verbal   Consent given by:  Patient   Risks discussed:  Infection, pain and poor cosmetic result   Alternatives discussed:  No treatment Anesthesia (see MAR for exact dosages):    Anesthesia method:  Local infiltration   Local anesthetic:  Lidocaine 2% w/o epi Laceration details:    Location:  Finger   Finger location:  L thumb   Length (cm):  0.5   Depth (mm):  2 Repair type:    Repair type:  Simple Exploration:    Hemostasis achieved with:  Direct pressure   Wound exploration: wound explored through full range of motion     Wound extent: no foreign bodies/material noted, no muscle damage noted and no tendon damage noted     Contaminated: no   Treatment:    Area cleansed with:  Soap and water   Amount of cleaning:  Standard   Irrigation method:  Tap   Visualized foreign bodies/material removed: no   Skin repair:    Repair method:  Sutures   Suture size:  5-0   Suture material:  Prolene   Suture technique:  Simple interrupted   Number of sutures:  3 Approximation:    Approximation:  Close Post-procedure details:    Dressing:  Bulky dressing   Patient tolerance of procedure:  Tolerated well, no immediate complications    (including critical care time)  Medications Ordered in UC Medications - No data to display   Initial Impression / Assessment and Plan / UC Course  I have reviewed the triage vital signs and the nursing notes.  Pertinent  labs & imaging results that were available during my care of the patient were reviewed by me and considered in my medical decision making (see chart for details).     Laceration performed, tolerated well.  Return in 7-10 days for removal.  Discussed sutured wound care. Discussed strict return precautions. Patient verbalized understanding and is agreeable with plan.   Final Clinical Impressions(s) / UC Diagnoses   Final diagnoses:  Laceration of left thumb without foreign body without damage to nail, initial encounter    ED Discharge Orders    None       Controlled Substance Prescriptions [place] Controlled Substance Registry consulted? Not Applicable   Lew Dawes, [place] 10/26/17 2037

## 2018-03-07 DIAGNOSIS — L03116 Cellulitis of left lower limb: Secondary | ICD-10-CM | POA: Diagnosis not present

## 2018-03-09 DIAGNOSIS — L03116 Cellulitis of left lower limb: Secondary | ICD-10-CM | POA: Diagnosis not present

## 2018-07-08 ENCOUNTER — Ambulatory Visit: Payer: BLUE CROSS/BLUE SHIELD | Admitting: Family Medicine

## 2018-07-08 ENCOUNTER — Encounter: Payer: Self-pay | Admitting: Family Medicine

## 2018-07-08 VITALS — BP 122/78 | HR 94 | Temp 98.4°F | Wt 315.0 lb

## 2018-07-08 DIAGNOSIS — I10 Essential (primary) hypertension: Secondary | ICD-10-CM

## 2018-07-08 DIAGNOSIS — Z9109 Other allergy status, other than to drugs and biological substances: Secondary | ICD-10-CM

## 2018-07-08 DIAGNOSIS — E785 Hyperlipidemia, unspecified: Secondary | ICD-10-CM | POA: Diagnosis not present

## 2018-07-08 DIAGNOSIS — F4010 Social phobia, unspecified: Secondary | ICD-10-CM

## 2018-07-08 MED ORDER — LORAZEPAM 1 MG PO TABS: ORAL_TABLET | ORAL | 1 refills | Status: DC

## 2018-07-08 NOTE — Patient Instructions (Signed)
Allergies, Adult An allergy is when your body's defense system (immune system) overreacts to an otherwise harmless substance (allergen) that you breathe in or eat or something that touches your skin. When you come into contact with something that you are allergic to, your immune system produces certain proteins (antibodies). These proteins cause cells to release chemicals (histamines) that trigger the symptoms of an allergic reaction. Allergies often affect the nasal passages (allergic rhinitis), eyes (allergic conjunctivitis), skin (atopic dermatitis), and stomach. Allergies can be mild or severe. Allergies cannot spread from person to person (are not contagious). They can develop at any age and may be outgrown. What increases the risk? You may be at greater risk of allergies if other people in your family have allergies. What are the signs or symptoms? Symptoms depend on what type of allergy you have. They may include:  Runny, stuffy nose.  Sneezing.  Itchy mouth, ears, or throat.  Postnasal drip.  Sore throat.  Itchy, red, watery, or puffy eyes.  Skin rash or hives.  Stomach pain.  Vomiting.  Diarrhea.  Bloating.  Wheezing or coughing.  People with a severe allergy to food, medicine, or an insect bite may have a life-threatening allergic reaction (anaphylaxis). Symptoms of anaphylaxis include:  Hives.  Itching.  Flushed face.  Swollen lips, tongue, or mouth.  Tight or swollen throat.  Chest pain or tightness in the chest.  Trouble breathing or shortness of breath.  Rapid heartbeat.  Dizziness or fainting.  Vomiting.  Diarrhea.  Pain in the abdomen.  How is this diagnosed? This condition is diagnosed based on:  Your symptoms.  Your family and medical history.  A physical exam.  You may need to see a health care provider who specializes in treating allergies (allergist). You may also have tests, including:  Skin tests to see which allergens are  causing your symptoms, such as: ? Skin prick test. In this test, your skin is pricked with a tiny needle and exposed to small amounts of possible allergens to see if your skin reacts. ? Intradermal skin test. In this test, a small amount of allergen is injected under your skin to see if your skin reacts. ? Patch test. In this test, a small amount of allergen is placed on your skin and then your skin is covered with a bandage. Your health care provider will check your skin after a couple of days to see if a rash has developed.  Blood tests.  Challenges tests. In this test, you inhale a small amount of allergen by mouth to see if you have an allergic reaction.  You may also be asked to:  Keep a food diary. A food diary is a record of all the foods and drinks you have in a day and any symptoms you experience.  Practice an elimination diet. An elimination diet involves eliminating specific foods from your diet and then adding them back in one by one to find out if a certain food causes an allergic reaction.  How is this treated? Treatment for allergies depends on your symptoms. Treatment may include:  Cold compresses to soothe itching and swelling.  Eye drops.  Nasal sprays.  Using a saline spray or container (neti pot) to flush out the nose (nasal irrigation). These methods can help clear away mucus and keep the nasal passages moist.  Using a humidifier.  Oral antihistamines or other medicines to block allergic reaction and inflammation.  Skin creams to treat rashes or itching.  Diet changes to   eliminate food allergy triggers.  Repeated exposure to tiny amounts of allergens to build up a tolerance and prevent future allergic reactions (immunotherapy). These include: ? Allergy shots. ? Oral treatment. This involves taking small doses of an allergen under the tongue (sublingual immunotherapy).  Emergency epinephrine injection (auto-injector) in case of an allergic emergency. This is  a self-injectable, pre-measured medicine that must be given within the first few minutes of a serious allergic reaction.  Follow these instructions at home:  Avoid known allergens whenever possible.  If you suffer from airborne allergens, wash out your nose daily. You can do this with a saline spray or a neti pot to flush out your nose (nasal irrigation).  Take over-the-counter and prescription medicines only as told by your health care provider.  Keep all follow-up visits as told by your health care provider. This is important.  If you are at risk of a severe allergic reaction (anaphylaxis), keep your auto-injector with you at all times.  If you have ever had anaphylaxis, wear a medical alert bracelet or necklace that states you have a severe allergy. Contact a health care provider if:  Your symptoms do not improve with treatment. Get help right away if:  You have symptoms of anaphylaxis, such as: ? Swollen mouth, tongue, or throat. ? Pain or tightness in your chest. ? Trouble breathing or shortness of breath. ? Dizziness or fainting. ? Severe abdominal pain, vomiting, or diarrhea. This information is not intended to replace advice given to you by your health care provider. Make sure you discuss any questions you have with your health care provider. Document Released: 10/02/2002 Document Revised: 11/07/2016 Document Reviewed: 01/25/2016 Elsevier Interactive Patient Education  2018 [employer].  Cholesterol Cholesterol is a white, waxy, fat-like substance that is needed by the human body in small amounts. The liver makes all the cholesterol we need. Cholesterol is carried from the liver by the blood through the blood vessels. Deposits of cholesterol (plaques) may build up on blood vessel (artery) walls. Plaques make the arteries narrower and stiffer. Cholesterol plaques increase the risk for heart attack and stroke. You cannot feel your cholesterol level even if it is very high. The  only way to know that it is high is to have a blood test. Once you know your cholesterol levels, you should keep a record of the test results. Work with your health care provider to keep your levels in the desired range. What do the results mean?  Total cholesterol is a rough measure of all the cholesterol in your blood.  LDL (low-density lipoprotein) is the "bad" cholesterol. This is the type that causes plaque to build up on the artery walls. You want this level to be low.  HDL (high-density lipoprotein) is the "good" cholesterol because it cleans the arteries and carries the LDL away. You want this level to be high.  Triglycerides are fat that the body can either burn for energy or store. High levels are closely linked to heart disease. What are the desired levels of cholesterol?  Total cholesterol below 200.  LDL below 100 for people who are at risk, below 70 for people at very high risk.  HDL above 40 is good. A level of 60 or higher is considered to be protective against heart disease.  Triglycerides below 150. How can I lower my cholesterol? Diet Follow your diet program as told by your health care provider.  Choose fish or white meat chicken and [place], roasted or baked. Limit fatty  cuts of red meat, fried foods, and processed meats, such as sausage and lunch meats.  Eat lots of fresh fruits and vegetables.  Choose whole grains, beans, pasta, potatoes, and cereals.  Choose olive oil, corn oil, or canola oil, and use only small amounts.  Avoid butter, mayonnaise, shortening, or palm kernel oils.  Avoid foods with trans fats.  Drink skim or nonfat milk and eat low-fat or nonfat yogurt and cheeses. Avoid whole milk, cream, ice cream, egg yolks, and full-fat cheeses.  Healthier desserts include angel food cake, ginger snaps, animal crackers, hard candy, popsicles, and low-fat or nonfat frozen yogurt. Avoid pastries, cakes, pies, and cookies.  Exercise  Follow your  exercise program as told by your health care provider. A regular program: ? Helps to decrease LDL and raise HDL. ? Helps with weight control.  Do things that increase your activity level, such as gardening, walking, and taking the stairs.  Ask your health care provider about ways that you can be more active in your daily life.  Medicine  Take over-the-counter and prescription medicines only as told by your health care provider. ? Medicine may be prescribed by your health care provider to help lower cholesterol and decrease the risk for heart disease. This is usually done if diet and exercise have failed to bring down cholesterol levels. ? If you have several risk factors, you may need medicine even if your levels are normal.  This information is not intended to replace advice given to you by your health care provider. Make sure you discuss any questions you have with your health care provider. Document Released: 04/03/2001 Document Revised: 02/04/2016 Document Reviewed: 01/07/2016 Elsevier Interactive Patient Education  2018 [employer].  Managing Your Hypertension Hypertension is commonly called high blood pressure. This is when the force of your blood pressing against the walls of your arteries is too strong. Arteries are blood vessels that carry blood from your heart throughout your body. Hypertension forces the heart to work harder to pump blood, and may cause the arteries to become narrow or stiff. Having untreated or uncontrolled hypertension can cause heart attack, stroke, kidney disease, and other problems. What are blood pressure readings? A blood pressure reading consists of a higher number over a lower number. Ideally, your blood pressure should be below 120/80. The first ("top") number is called the systolic pressure. It is a measure of the pressure in your arteries as your heart beats. The second ("bottom") number is called the diastolic pressure. It is a measure of the pressure  in your arteries as the heart relaxes. What does my blood pressure reading mean? Blood pressure is classified into four stages. Based on your blood pressure reading, your health care provider may use the following stages to determine what type of treatment you need, if any. Systolic pressure and diastolic pressure are measured in a unit called mm Hg. Normal  Systolic pressure: below 120.  Diastolic pressure: below 80. Elevated  Systolic pressure: 120-129.  Diastolic pressure: below 80. Hypertension stage 1  Systolic pressure: 130-139.  Diastolic pressure: 80-89. Hypertension stage 2  Systolic pressure: 140 or above.  Diastolic pressure: 90 or above. What health risks are associated with hypertension? Managing your hypertension is an important responsibility. Uncontrolled hypertension can lead to:  A heart attack.  A stroke.  A weakened blood vessel (aneurysm).  Heart failure.  Kidney damage.  Eye damage.  Metabolic syndrome.  Memory and concentration problems.  What changes can I make to manage my  hypertension? Hypertension can be managed by making lifestyle changes and possibly by taking medicines. Your health care provider will help you make a plan to bring your blood pressure within a normal range. Eating and drinking  Eat a diet that is high in fiber and potassium, and low in salt (sodium), added sugar, and fat. An example eating plan is called the DASH (Dietary Approaches to Stop Hypertension) diet. To eat this way: ? Eat plenty of fresh fruits and vegetables. Try to fill half of your plate at each meal with fruits and vegetables. ? Eat whole grains, such as whole wheat pasta, brown rice, or whole grain bread. Fill about one quarter of your plate with whole grains. ? Eat low-fat diary products. ? Avoid fatty cuts of meat, processed or cured meats, and poultry with skin. Fill about one quarter of your plate with lean proteins such as fish, chicken without skin,  beans, eggs, and tofu. ? Avoid premade and processed foods. These tend to be higher in sodium, added sugar, and fat.  Reduce your daily sodium intake. Most people with hypertension should eat less than 1,500 mg of sodium a day.  Limit alcohol intake to no more than 1 drink a day for nonpregnant women and 2 drinks a day for men. One drink equals 12 oz of beer, 5 oz of wine, or 1 oz of hard liquor. Lifestyle  Work with your health care provider to maintain a healthy body weight, or to lose weight. Ask what an ideal weight is for you.  Get at least 30 minutes of exercise that causes your heart to beat faster (aerobic exercise) most days of the week. Activities may include walking, swimming, or biking.  Include exercise to strengthen your muscles (resistance exercise), such as weight lifting, as part of your weekly exercise routine. Try to do these types of exercises for 30 minutes at least 3 days a week.  Do not use any products that contain nicotine or tobacco, such as cigarettes and e-cigarettes. If you need help quitting, ask your health care provider.  Control any long-term (chronic) conditions you have, such as high cholesterol or diabetes. Monitoring  Monitor your blood pressure at home as told by your health care provider. Your personal target blood pressure may vary depending on your medical conditions, your age, and other factors.  Have your blood pressure checked regularly, as often as told by your health care provider. Working with your health care provider  Review all the medicines you take with your health care provider because there may be side effects or interactions.  Talk with your health care provider about your diet, exercise habits, and other lifestyle factors that may be contributing to hypertension.  Visit your health care provider regularly. Your health care provider can help you create and adjust your plan for managing hypertension. Will I need medicine to control  my blood pressure? Your health care provider may prescribe medicine if lifestyle changes are not enough to get your blood pressure under control, and if:  Your systolic blood pressure is 130 or higher.  Your diastolic blood pressure is 80 or higher.  Take medicines only as told by your health care provider. Follow the directions carefully. Blood pressure medicines must be taken as prescribed. The medicine does not work as well when you skip doses. Skipping doses also puts you at risk for problems. Contact a health care provider if:  You think you are having a reaction to medicines you have taken.  You  have repeated (recurrent) headaches.  You feel dizzy.  You have swelling in your ankles.  You have trouble with your vision. Get help right away if:  You develop a severe headache or confusion.  You have unusual weakness or numbness, or you feel faint.  You have severe pain in your chest or abdomen.  You vomit repeatedly.  You have trouble breathing. Summary  Hypertension is when the force of blood pumping through your arteries is too strong. If this condition is not controlled, it may put you at risk for serious complications.  Your personal target blood pressure may vary depending on your medical conditions, your age, and other factors. For most people, a normal blood pressure is less than 120/80.  Hypertension is managed by lifestyle changes, medicines, or both. Lifestyle changes include weight loss, eating a healthy, low-sodium diet, exercising more, and limiting alcohol. This information is not intended to replace advice given to you by your health care provider. Make sure you discuss any questions you have with your health care provider. Document Released: 04/02/2012 Document Revised: 06/06/2016 Document Reviewed: 06/06/2016 Elsevier Interactive Patient Education  [employer].

## 2018-07-08 NOTE — Progress Notes (Signed)
Subjective:    Patient ID: Jim Goodwin, male    DOB: 12-08-1975, 42 y.o.   MRN: [id number]  No chief complaint on file.   HPI Patient was seen today for f/u on chronic conditions and TOC, previously seen by Dr. Tawanna Cooler.  HTN: -taking lisinopril 10 mg and Cardura 8 mg daily -Occasionally checks BP at home.  Typically 129/82, 121/83, 127/82 -Cut out sodium and fried foods -Was biking 6 miles per week when it was warmer outside. -Has a rowing machine at home but has not been using lately  HLD: -Tried lifestyle modifications -Currently taking simvastatin 40 mg daily -Denies myalgias -Cut out fried foods  Flying anxiety: -endorses anxiety when flying -Has to fly for work 5-8 times per year.  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 -Notes recent use of a wood from [place] which caused him to itch. -Currently taking Tagamet to help cut down on allergy  Past Medical History:  Diagnosis Date  . Allergy   . Arthritis   . Hyperlipidemia   . Hypertension   . Nephrolithiasis     Allergies  Allergen Reactions  . Duracillin As [Penicillin G Procaine]     GI upset    ROS General: Denies fever, chills, night sweats, changes in weight, changes in appetite HEENT: Denies headaches, ear pain, changes in vision, rhinorrhea, sore throat CV: Denies CP, palpitations, SOB, orthopnea Pulm: Denies SOB, cough, wheezing GI: Denies abdominal pain, nausea, vomiting, diarrhea, constipation GU: Denies dysuria, hematuria, frequency, vaginal discharge Msk: Denies muscle cramps, joint pains Neuro: Denies weakness, numbness, tingling Skin: Denies rashes, bruising Psych: Denies depression, hallucinations  +anxiety when flying     Objective:    Blood pressure 122/78, pulse  94, temperature 98.4 F (36.9 C), temperature source Oral, weight (!) 315 lb (142.9 kg), SpO2 97 %.  Gen. Pleasant, well-nourished, in no distress, normal affect   HEENT: [place]/AT, face symmetric, no scleral icterus, PERRLA, nares patent without drainage, pharynx without erythema or exudate. TMs normal b/l Lungs: no accessory muscle use, CTAB, no wheezes or rales Cardiovascular: RRR, no m/r/g, no peripheral edema Neuro:  A&Ox3, CN II-XII intact, normal gait  Wt Readings from Last 3 Encounters:  07/08/18 (!) 315 lb (142.9 kg)  08/13/17 (!) 308 lb (139.7 kg)  07/12/17 (!) 310 lb 1.6 oz (140.7 kg)    Lab Results  Component Value Date   WBC 6.1 08/13/2017   HGB 15.0 08/13/2017   HCT 44.9 08/13/2017   PLT 302.0 08/13/2017   GLUCOSE 83 08/13/2017   CHOL 154 08/13/2017   TRIG 116.0 08/13/2017   HDL 39.80 08/13/2017   LDLCALC 91 08/13/2017   ALT 25 08/13/2017   AST 19 08/13/2017   NA 139 08/13/2017   K 4.3 08/13/2017   CL 101 08/13/2017   CREATININE 0.90 08/13/2017   BUN 14 08/13/2017   CO2 33 (H) 08/13/2017   TSH 1.47 08/13/2017   HGBA1C 5.7 08/13/2017    Assessment/Plan:  Essential hypertension -Continue lisinopril 10 mg and Cardura 8 mg daily -Continue checking BP at home -Continue lifestyle modifications  Hyperlipidemia, unspecified hyperlipidemia type -Continue simvastatin 40 mg daily -Continue lifestyle modifications including limiting the amount of fried foods eaten and increase exercise -At next OFV check lipids  Social anxiety disorder  -Anxiety noted  prior to flying. - Plan: LORazepam (ATIVAN) 1 MG tablet  Environmental allergies -Discussed avoiding known allergens -Continue Flonase and Tagamet  Follow-up in the next few months for CPE  Abbe AmsterdamShannon Neeley Sedivy, MD

## 2018-07-28 DIAGNOSIS — Z23 Encounter for immunization: Secondary | ICD-10-CM | POA: Diagnosis not present

## 2018-08-13 ENCOUNTER — Encounter: Payer: Self-pay | Admitting: Family Medicine

## 2018-08-13 ENCOUNTER — Ambulatory Visit (INDEPENDENT_AMBULATORY_CARE_PROVIDER_SITE_OTHER): Payer: BLUE CROSS/BLUE SHIELD | Admitting: Family Medicine

## 2018-08-13 VITALS — BP 128/80 | HR 60 | Temp 98.2°F | Wt 315.0 lb

## 2018-08-13 DIAGNOSIS — Z0000 Encounter for general adult medical examination without abnormal findings: Secondary | ICD-10-CM | POA: Diagnosis not present

## 2018-08-13 DIAGNOSIS — Z131 Encounter for screening for diabetes mellitus: Secondary | ICD-10-CM

## 2018-08-13 DIAGNOSIS — E782 Mixed hyperlipidemia: Secondary | ICD-10-CM | POA: Diagnosis not present

## 2018-08-13 DIAGNOSIS — F419 Anxiety disorder, unspecified: Secondary | ICD-10-CM | POA: Diagnosis not present

## 2018-08-13 DIAGNOSIS — Z6841 Body Mass Index (BMI) 40.0 and over, adult: Secondary | ICD-10-CM

## 2018-08-13 LAB — CBC
HCT: 45.7 % (ref 39.0–52.0)
Hemoglobin: 15.5 g/dL (ref 13.0–17.0)
MCHC: 33.9 g/dL (ref 30.0–36.0)
MCV: 89.2 fl (ref 78.0–100.0)
PLATELETS: 309 10*3/uL (ref 150.0–400.0)
RBC: 5.13 Mil/uL (ref 4.22–5.81)
RDW: 12.9 % (ref 11.5–15.5)
WBC: 7.2 10*3/uL (ref 4.0–10.5)

## 2018-08-13 LAB — HEMOGLOBIN A1C: Hgb A1c MFr Bld: 5.6 % (ref 4.6–6.5)

## 2018-08-13 LAB — BASIC METABOLIC PANEL
BUN: 13 mg/dL (ref 6–23)
CO2: 31 mEq/L (ref 19–32)
Calcium: 9.5 mg/dL (ref 8.4–10.5)
Chloride: 101 mEq/L (ref 96–112)
Creatinine, Ser: 1.01 mg/dL (ref 0.40–1.50)
GFR: 80.88 mL/min (ref >60.00)
Glucose, Bld: 88 mg/dL (ref 70–99)
Potassium: 4.7 mEq/L (ref 3.5–5.1)
Sodium: 139 mEq/L (ref 135–145)

## 2018-08-13 LAB — LIPID PANEL
CHOL/HDL RATIO: 4
Cholesterol: 164 mg/dL (ref 0–200)
HDL: 43.3 mg/dL (ref >39.00)
LDL Cholesterol: 97 mg/dL (ref 0–99)
NonHDL: 120.36
Triglycerides: 117 mg/dL (ref 0.0–149.0)
VLDL: 23.4 mg/dL (ref 0.0–40.0)

## 2018-08-13 NOTE — Progress Notes (Signed)
Subjective:     Jim Goodwin is a 43 y.o. male and is here for a comprehensive physical exam. The patient reports problems - increased anxiety lately. States has not been able to figure out why.  States needs to talk to his father, who is a Programmer, multimediapreacher.  Otherwise doing well.  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  Plans to restart med.  Social History   Socioeconomic History  . Marital status: Married    Spouse name: Not on file  . Number of children: Not on file  . Years of education: Not on file  . Highest education level: Not on file  Occupational History  . Not on file  Social Needs  . Financial resource strain: Not on file  . Food insecurity:    Worry: Not on file    Inability: Not on file  . Transportation needs:    Medical: Not on file    Non-medical: Not on file  Tobacco Use  . Smoking status: Former Smoker    Last attempt to quit: 07/23/2005    Years since quitting: 13.0  . Smokeless tobacco: Former Engineer, waterUser  Substance and Sexual Activity  . Alcohol use: Yes    Alcohol/week: 2.0 standard drinks    Types: 1 Cans of beer, 1 Shots of liquor per week    Comment: maybe on a week  . Drug use: No  . Sexual activity: Yes    Partners: Female, Male  Lifestyle  . Physical activity:    Days per week: Not on file    Minutes per session: Not on file  . Stress: Not on file  Relationships  . Social connections:    Talks on phone: Not on file    Gets together: Not on file    Attends religious service: Not on file    Active member of club or organization: Not on file    Attends meetings of clubs or organizations: Not on file    Relationship status: Not on file  . Intimate partner violence:    Fear of current or ex partner: Not on file    Emotionally abused: Not on file    Physically abused: Not on file    Forced sexual activity: Not on file  Other Topics Concern  . Not on file  Social History Narrative  . Not on file    Health Maintenance  Topic Date Due  . TETANUS/TDAP  04/25/2025  . INFLUENZA VACCINE  Completed  . HIV Screening  Completed    The following portions of the patient's history were reviewed and updated as appropriate: allergies, current medications, past family history, past medical history, past social history, past surgical history and problem list.  Review of Systems Pertinent items noted in HPI and remainder of comprehensive ROS otherwise negative.   Objective:    BP 128/80 (BP Location: Left Arm, Patient Position: Sitting, Cuff Size: Large)   Pulse 60   Temp 98.2 F (36.8 C) (Oral)   Wt (!) 315 lb (142.9 kg)   SpO2 98%   BMI 45.20 kg/m  General appearance: alert, cooperative and no distress Head: Normocephalic, without obvious abnormality, atraumatic Eyes: conjunctivae/corneas clear. PERRL, EOM's intact. Fundi benign. Ears: normal TM's and external ear canals both ears Nose: Nares normal. Septum midline. Mucosa normal. No drainage or sinus tenderness. Throat: lips, mucosa, and tongue normal; teeth and gums normal Neck: no adenopathy, no carotid bruit, no JVD, supple, symmetrical, trachea midline and  thyroid not enlarged, symmetric, no tenderness/mass/nodules Lungs: clear to auscultation bilaterally Heart: regular rate and rhythm, S1, S2 normal, no murmur, click, rub or gallop Abdomen: soft, non-tender; bowel sounds normal; no masses,  no organomegaly Extremities: extremities normal, atraumatic, no cyanosis or edema Skin: Skin color, texture, turgor normal. No rashes or lesions Neurologic: Alert and oriented X 3, normal strength and tone. Normal symmetric reflexes. Normal coordination and gait    Assessment:    Healthy male exam.   Plan:     Anticipatory guidance given including wearing seatbelts, smoke detectors in the home, increasing physical activity, increasing p.o. intake of water and vegetables. -will order labs -discussed immunizations.  Pt declines at this  time -given handout -next CPE in 1 yr See After Visit Summary for Counseling Recommendations    Anxiety -PHQ 9 score 3 -GAD 7 score 7 -discussed considering counseling.  Pt declines at this time.  Abbe [place], MD

## 2018-08-13 NOTE — Patient Instructions (Addendum)
Preventive Care 40-64 Years, Male Preventive care refers to lifestyle choices and visits with your health care provider that can promote health and wellness. What does preventive care include?   A yearly physical exam. This is also called an annual well check.  Dental exams once or twice a year.  Routine eye exams. Ask your health care provider how often you should have your eyes checked.  Personal lifestyle choices, including: ? Daily care of your teeth and gums. ? Regular physical activity. ? Eating a healthy diet. ? Avoiding tobacco and drug use. ? Limiting alcohol use. ? Practicing safe sex. ? Taking low-dose aspirin every day starting at age 50. What happens during an annual well check? The services and screenings done by your health care provider during your annual well check will depend on your age, overall health, lifestyle risk factors, and family history of disease. Counseling Your health care provider may ask you questions about your:  Alcohol use.  Tobacco use.  Drug use.  Emotional well-being.  Home and relationship well-being.  Sexual activity.  Eating habits.  Work and work environment. Screening You may have the following tests or measurements:  Height, weight, and BMI.  Blood pressure.  Lipid and cholesterol levels. These may be checked every 5 years, or more frequently if you are over 50 years old.  Skin check.  Lung cancer screening. You may have this screening every year starting at age 55 if you have a 30-pack-year history of smoking and currently smoke or have quit within the past 15 years.  Colorectal cancer screening. All adults should have this screening starting at age 50 and continuing until age 75. Your health care provider may recommend screening at age 45. You will have tests every 1-10 years, depending on your results and the type of screening test. People at increased risk should start screening at an earlier age. Screening tests may  include: ? Guaiac-based fecal occult blood testing. ? Fecal immunochemical test (FIT). ? Stool DNA test. ? Virtual colonoscopy. ? Sigmoidoscopy. During this test, a flexible tube with a tiny camera (sigmoidoscope) is used to examine your rectum and lower colon. The sigmoidoscope is inserted through your anus into your rectum and lower colon. ? Colonoscopy. During this test, a long, thin, flexible tube with a tiny camera (colonoscope) is used to examine your entire colon and rectum.  Prostate cancer screening. Recommendations will vary depending on your family history and other risks.  Hepatitis C blood test.  Hepatitis B blood test.  Sexually transmitted disease (STD) testing.  Diabetes screening. This is done by checking your blood sugar (glucose) after you have not eaten for a while (fasting). You may have this done every 1-3 years. Discuss your test results, treatment options, and if necessary, the need for more tests with your health care provider. Vaccines Your health care provider may recommend certain vaccines, such as:  Influenza vaccine. This is recommended every year.  Tetanus, diphtheria, and acellular pertussis (Tdap, Td) vaccine. You may need a Td booster every 10 years.  Varicella vaccine. You may need this if you have not been vaccinated.  Zoster vaccine. You may need this after age 60.  Measles, mumps, and rubella (MMR) vaccine. You may need at least one dose of MMR if you were born in 1957 or later. You may also need a second dose.  Pneumococcal 13-valent conjugate (PCV13) vaccine. You may need this if you have certain conditions and have not been vaccinated.  Pneumococcal polysaccharide (PPSV23) vaccine.   You may need one or two doses if you smoke cigarettes or if you have certain conditions.  Meningococcal vaccine. You may need this if you have certain conditions.  Hepatitis A vaccine. You may need this if you have certain conditions or if you travel or work in  places where you may be exposed to hepatitis A.  Hepatitis B vaccine. You may need this if you have certain conditions or if you travel or work in places where you may be exposed to hepatitis B.  Haemophilus influenzae type b (Hib) vaccine. You may need this if you have certain risk factors. Talk to your health care provider about which screenings and vaccines you need and how often you need them. This information is not intended to replace advice given to you by your health care provider. Make sure you discuss any questions you have with your health care provider. Document Released: 08/05/2015 Document Revised: 08/29/2017 Document Reviewed: 05/10/2015 Elsevier Interactive Patient Education  2019 [place]  After being diagnosed with an anxiety disorder, you may be relieved to know why you have felt or behaved a certain way. It is natural to also feel overwhelmed about the treatment ahead and what it will mean for your life. With care and support, you can manage this condition and recover from it. How to cope with anxiety Dealing with stress Stress is your body's reaction to life changes and events, both good and bad. Stress can last just a few hours or it can be ongoing. Stress can play a major role in anxiety, so it is important to learn both how to cope with stress and how to think about it differently. Talk with your health care provider or a counselor to learn more about stress reduction. He or she may suggest some stress reduction techniques, such as:  Music therapy. This can include creating or listening to music that you enjoy and that inspires you.  Mindfulness-based meditation. This involves being aware of your normal breaths, rather than trying to control your breathing. It can be done while sitting or walking.  Centering prayer. This is a kind of meditation that involves focusing on a word, phrase, or sacred image that is meaningful to you and that brings  you peace.  Deep breathing. To do this, expand your stomach and inhale slowly through your nose. Hold your breath for 3-5 seconds. Then exhale slowly, allowing your stomach muscles to relax.  Self-talk. This is a skill where you identify thought patterns that lead to anxiety reactions and correct those thoughts.  Muscle relaxation. This involves tensing muscles then relaxing them. Choose a stress reduction technique that fits your lifestyle and personality. Stress reduction techniques take time and practice. Set aside 5-15 minutes a day to do them. Therapists can offer training in these techniques. The training may be covered by some insurance plans. Other things you can do to manage stress include:  Keeping a stress diary. This can help you learn what triggers your stress and ways to control your response.  Thinking about how you respond to certain situations. You may not be able to control everything, but you can control your reaction.  Making time for activities that help you relax, and not feeling guilty about spending your time in this way. Therapy combined with coping and stress-reduction skills provides the best chance for successful treatment. Medicines Medicines can help ease symptoms. Medicines for anxiety include:  Anti-anxiety drugs.  Antidepressants.  Beta-blockers. Medicines may be used  as the main treatment for anxiety disorder, along with therapy, or if other treatments are not working. Medicines should be prescribed by a health care provider. Relationships Relationships can play a big part in helping you recover. Try to spend more time connecting with trusted friends and family members. Consider going to couples counseling, taking family education classes, or going to family therapy. Therapy can help you and others better understand the condition. How to recognize changes in your condition Everyone has a different response to treatment for anxiety. Recovery from anxiety  happens when symptoms decrease and stop interfering with your daily activities at home or work. This may mean that you will start to:  Have better concentration and focus.  Sleep better.  Be less irritable.  Have more energy.  Have improved memory. It is important to recognize when your condition is getting worse. Contact your health care provider if your symptoms interfere with home or work and you do not feel like your condition is improving. Where to find help and support: You can get help and support from these sources:  Self-help groups.  Online and [employer].  A trusted spiritual leader.  Couples counseling.  Family education classes.  Family therapy. Follow these instructions at home:  Eat a healthy diet that includes plenty of vegetables, fruits, whole grains, low-fat dairy products, and lean protein. Do not eat a lot of foods that are high in solid fats, added sugars, or salt.  Exercise. Most adults should do the following: ? Exercise for at least 150 minutes each week. The exercise should increase your heart rate and make you sweat (moderate-intensity exercise). ? Strengthening exercises at least twice a week.  Cut down on caffeine, tobacco, alcohol, and other potentially harmful substances.  Get the right amount and quality of sleep. Most adults need 7-9 hours of sleep each night.  Make choices that simplify your life.  Take over-the-counter and prescription medicines only as told by your health care provider.  Avoid caffeine, alcohol, and certain over-the-counter cold medicines. These may make you feel worse. Ask your pharmacist which medicines to avoid.  Keep all follow-up visits as told by your health care provider. This is important. Questions to ask your health care provider  Would I benefit from therapy?  How often should I follow up with a health care provider?  How long do I need to take medicine?  Are there any long-term side  effects of my medicine?  Are there any alternatives to taking medicine? Contact a health care provider if:  You have a hard time staying focused or finishing daily tasks.  You spend many hours a day feeling worried about everyday life.  You become exhausted by worry.  You start to have headaches, feel tense, or have nausea.  You urinate more than normal.  You have diarrhea. Get help right away if:  You have a racing heart and shortness of breath.  You have thoughts of hurting yourself or others. If you ever feel like you may hurt yourself or others, or have thoughts about taking your own life, get help right away. You can go to your nearest emergency department or call:  Your local emergency services (911 in the [place]  A suicide crisis helpline, such as the [place] at 720-612-2991. This is open 24-hours a day. Summary  Taking steps to deal with stress can help calm you.  Medicines cannot cure anxiety disorders, but they can help ease symptoms.  Family, friends, and  partners can play a big part in helping you recover from an anxiety disorder. This information is not intended to replace advice given to you by your health care provider. Make sure you discuss any questions you have with your health care provider. Document Released: 07/03/2016 Document Revised: 07/03/2016 Document Reviewed: 07/03/2016 Elsevier Interactive Patient Education  2019 [employer].

## 2018-08-14 ENCOUNTER — Other Ambulatory Visit: Payer: Self-pay | Admitting: Family Medicine

## 2018-08-14 MED ORDER — LISINOPRIL 10 MG PO TABS: 10.0000 mg | ORAL_TABLET | Freq: Every day | ORAL | 1 refills | Status: DC

## 2018-08-14 NOTE — Telephone Encounter (Signed)
Copied from CRM [id number]. Topic: Quick Communication - Rx Refill/Question >> Aug 14, 2018  2:54 PM Jolayne Haines L wrote: Medication: lisinopril (PRINIVIL,ZESTRIL) 10 MG tablet  Has the patient contacted their pharmacy? Yes (Agent: If no, request that the patient contact the pharmacy for the refill.) (Agent: If yes, when and what did the pharmacy advise?)  Preferred Pharmacy (with phone number or street name): [facility] [address] [place] - [address] [place] [address]    Agent: Please be advised that RX refills may take up to 3 business days. We ask that you follow-up with your pharmacy.

## 2018-08-16 ENCOUNTER — Encounter: Payer: Self-pay | Admitting: Family Medicine

## 2018-08-20 ENCOUNTER — Other Ambulatory Visit: Payer: Self-pay

## 2018-08-20 MED ORDER — SIMVASTATIN 40 MG PO TABS: ORAL_TABLET | ORAL | 1 refills | Status: DC

## 2018-08-20 MED ORDER — DOXAZOSIN MESYLATE 8 MG PO TABS: ORAL_TABLET | ORAL | 1 refills | Status: DC

## 2018-08-20 MED ORDER — SERTRALINE HCL 50 MG PO TABS: ORAL_TABLET | ORAL | 0 refills | Status: DC

## 2018-08-26 DIAGNOSIS — I10 Essential (primary) hypertension: Secondary | ICD-10-CM | POA: Diagnosis not present

## 2018-08-26 DIAGNOSIS — J111 Influenza due to unidentified influenza virus with other respiratory manifestations: Secondary | ICD-10-CM | POA: Diagnosis not present

## 2018-10-26 ENCOUNTER — Other Ambulatory Visit: Payer: Self-pay | Admitting: Family Medicine

## 2018-12-19 ENCOUNTER — Ambulatory Visit (INDEPENDENT_AMBULATORY_CARE_PROVIDER_SITE_OTHER): Payer: BLUE CROSS/BLUE SHIELD | Admitting: Family Medicine

## 2018-12-19 ENCOUNTER — Other Ambulatory Visit: Payer: Self-pay

## 2018-12-19 ENCOUNTER — Encounter: Payer: Self-pay | Admitting: Family Medicine

## 2018-12-19 DIAGNOSIS — W102XXA Fall (on)(from) incline, initial encounter: Secondary | ICD-10-CM | POA: Diagnosis not present

## 2018-12-19 DIAGNOSIS — M545 Low back pain, unspecified: Secondary | ICD-10-CM

## 2018-12-19 MED ORDER — MELOXICAM 7.5 MG PO TABS: 7.5000 mg | ORAL_TABLET | Freq: Every day | ORAL | 0 refills | Status: DC

## 2018-12-19 MED ORDER — CYCLOBENZAPRINE HCL 5 MG PO TABS: 5.0000 mg | ORAL_TABLET | Freq: Three times a day (TID) | ORAL | 0 refills | Status: DC | PRN

## 2018-12-19 NOTE — Progress Notes (Signed)
Virtual Visit via Video Note Visit started on doxy, but audio went out.  Pt called on the phone to complete visit.  I connected with [name] on 12/19/18 at  2:00 PM EDT by a video enabled telemedicine application and verified that I am speaking with the correct person using two identifiers.  Location patient: home Location provider:work or home office Persons participating in the virtual visit: patient, provider  I discussed the limitations of evaluation and management by telemedicine and the availability of in person appointments. The patient expressed understanding and agreed to proceed.   HPI: Pt putting in a patio in the back yard.  He went to move some in the shed when he slipped on a ramp landing on his back ~10 am.  Was carrying plastic edging and some spikes, states was not heavy. Tried tylenol and heat.  Back becoming stiff in "area above right kidney".  Has a clenching pain if tries to move fast, hurts when laughs.  No pain with deep breathing.  Denies SOB, chest pain, HAs, dizziness, loss of bowel or bladder.   ROS: See pertinent positives and negatives per HPI.  Past Medical History:  Diagnosis Date  . Allergy   . Arthritis   . Hyperlipidemia   . Hypertension   . Nephrolithiasis     Past Surgical History:  Procedure Laterality Date  . HYDROCELE EXCISION     [id number]  . KNEE ARTHROSCOPY     left    Family History  Problem Relation Age of Onset  . Kidney disease Father   . Hypertension Other   . Cancer Paternal Grandmother        Lung    SOCIAL HX:    Current Outpatient Medications:  .  aspirin 81 MG EC tablet, Take 81 mg by mouth daily.  , Disp: , Rfl:  .  cetirizine (ZYRTEC) 10 MG tablet, Take 10 mg by mouth daily.  , Disp: , Rfl:  .  doxazosin (CARDURA) 8 MG tablet, Take 1 tablet at bedtime, Disp: 90 tablet, Rfl: 1 .  fluticasone (FLONASE) 50 MCG/ACT nasal spray, Place 1 spray into both nostrils daily. As needed for additional allergy relief.,  Disp: 32 g, Rfl: 6 .  lisinopril (PRINIVIL,ZESTRIL) 10 MG tablet, Take 1 tablet (10 mg total) by mouth daily., Disp: 90 tablet, Rfl: 1 .  LORazepam (ATIVAN) 1 MG tablet, One half to one tablet one hour prior to flying, Disp: 20 tablet, Rfl: 1 .  sertraline (ZOLOFT) 50 MG tablet, TAKE 1 TABLET DAILY, Disp: 90 tablet, Rfl: 0 .  simvastatin (ZOCOR) 40 MG tablet, TAKE 1 TABLET (40 MG TOTAL) BY MOUTH AT BEDTIME., Disp: 100 tablet, Rfl: 1  EXAM:  VITALS per patient if applicable: RR between 12-20 bpm  GENERAL: alert, oriented, appears well and in no acute distress  HEENT: atraumatic, conjunctiva clear, no obvious abnormalities on inspection of external nose and ears  NECK: normal movements of the head and neck  LUNGS: on inspection no signs of respiratory distress, breathing rate appears normal, no obvious gross SOB, gasping or wheezing  CV: no obvious cyanosis  MS: moves all visible extremities without noticeable abnormality  PSYCH/NEURO: pleasant and cooperative, no obvious depression or anxiety, speech and thought processing grossly intact  ASSESSMENT AND PLAN:  Discussed the following assessment and plan:  Acute right-sided low back pain without sciatica  - Plan: cyclobenzaprine (FLEXERIL) 5 MG tablet, meloxicam (MOBIC) 7.5 MG tablet -discussed imaging for continued or worsening symptoms -given precautions  Fall (on)(from) incline, initial encounter  F/u prn   I discussed the assessment and treatment plan with the patient. The patient was provided an opportunity to ask questions and all were answered. The patient agreed with the plan and demonstrated an understanding of the instructions.   The patient was advised to call back or seek an in-person evaluation if the symptoms worsen or if the condition fails to improve as anticipated.  I provided 11 minutes of non face-to-face time during this encounter.  Deeann SaintShannon R Iasia Forcier, MD

## 2019-01-24 ENCOUNTER — Other Ambulatory Visit: Payer: Self-pay | Admitting: Family Medicine

## 2019-01-26 ENCOUNTER — Other Ambulatory Visit: Payer: Self-pay | Admitting: Family Medicine

## 2019-04-11 ENCOUNTER — Other Ambulatory Visit: Payer: Self-pay | Admitting: Family Medicine

## 2019-04-22 DIAGNOSIS — Z23 Encounter for immunization: Secondary | ICD-10-CM | POA: Diagnosis not present

## 2019-07-19 ENCOUNTER — Other Ambulatory Visit: Payer: Self-pay | Admitting: Family Medicine

## 2019-09-16 ENCOUNTER — Other Ambulatory Visit: Payer: Self-pay | Admitting: Family Medicine

## 2019-11-19 ENCOUNTER — Other Ambulatory Visit: Payer: Self-pay | Admitting: Family Medicine

## 2019-11-20 NOTE — Telephone Encounter (Signed)
Pt needs appointment for further refills 

## 2019-12-18 ENCOUNTER — Other Ambulatory Visit: Payer: Self-pay | Admitting: Family Medicine

## 2020-01-18 ENCOUNTER — Other Ambulatory Visit: Payer: Self-pay | Admitting: Family Medicine

## 2020-01-21 ENCOUNTER — Other Ambulatory Visit: Payer: Self-pay | Admitting: Family Medicine

## 2020-01-29 ENCOUNTER — Ambulatory Visit (INDEPENDENT_AMBULATORY_CARE_PROVIDER_SITE_OTHER): Payer: BC Managed Care – PPO | Admitting: Family Medicine

## 2020-01-29 ENCOUNTER — Other Ambulatory Visit: Payer: Self-pay

## 2020-01-29 ENCOUNTER — Encounter: Payer: Self-pay | Admitting: Family Medicine

## 2020-01-29 VITALS — BP 112/72 | HR 96 | Temp 98.1°F | Ht 70.0 in | Wt 324.2 lb

## 2020-01-29 DIAGNOSIS — I10 Essential (primary) hypertension: Secondary | ICD-10-CM | POA: Diagnosis not present

## 2020-01-29 DIAGNOSIS — F419 Anxiety disorder, unspecified: Secondary | ICD-10-CM

## 2020-01-29 DIAGNOSIS — Z0000 Encounter for general adult medical examination without abnormal findings: Secondary | ICD-10-CM | POA: Diagnosis not present

## 2020-01-29 DIAGNOSIS — E785 Hyperlipidemia, unspecified: Secondary | ICD-10-CM | POA: Diagnosis not present

## 2020-01-29 DIAGNOSIS — R635 Abnormal weight gain: Secondary | ICD-10-CM | POA: Diagnosis not present

## 2020-01-29 MED ORDER — DOXAZOSIN MESYLATE 8 MG PO TABS: 8.0000 mg | ORAL_TABLET | Freq: Every day | ORAL | 3 refills | Status: DC

## 2020-01-29 MED ORDER — LISINOPRIL 10 MG PO TABS: 10.0000 mg | ORAL_TABLET | Freq: Every day | ORAL | 3 refills | Status: DC

## 2020-01-29 MED ORDER — SIMVASTATIN 40 MG PO TABS: 40.0000 mg | ORAL_TABLET | Freq: Every day | ORAL | 3 refills | Status: DC

## 2020-01-29 MED ORDER — SERTRALINE HCL 50 MG PO TABS: 50.0000 mg | ORAL_TABLET | Freq: Every day | ORAL | 1 refills | Status: DC

## 2020-01-29 NOTE — Patient Instructions (Addendum)
Preventive Care 41-44 Years Old, Male Preventive care refers to lifestyle choices and visits with your health care provider that can promote health and wellness. This includes:  A yearly physical exam. This is also called an annual well check.  Regular dental and eye exams.  Immunizations.  Screening for certain conditions.  Healthy lifestyle choices, such as eating a healthy diet, getting regular exercise, not using drugs or products that contain nicotine and tobacco, and limiting alcohol use. What can I expect for my preventive care visit? Physical exam Your health care provider will check:  Height and weight. These may be used to calculate body mass index (BMI), which is a measurement that tells if you are at a healthy weight.  Heart rate and blood pressure.  Your skin for abnormal spots. Counseling Your health care provider may ask you questions about:  Alcohol, tobacco, and drug use.  Emotional well-being.  Home and relationship well-being.  Sexual activity.  Eating habits.  Work and work Statistician. What immunizations do I need?  Influenza (flu) vaccine  This is recommended every year. Tetanus, diphtheria, and pertussis (Tdap) vaccine  You may need a Td booster every 10 years. Varicella (chickenpox) vaccine  You may need this vaccine if you have not already been vaccinated. Zoster (shingles) vaccine  You may need this after age 64. Measles, mumps, and rubella (MMR) vaccine  You may need at least one dose of MMR if you were born in 1957 or later. You may also need a second dose. Pneumococcal conjugate (PCV13) vaccine  You may need this if you have certain conditions and were not previously vaccinated. Pneumococcal polysaccharide (PPSV23) vaccine  You may need one or two doses if you smoke cigarettes or if you have certain conditions. Meningococcal conjugate (MenACWY) vaccine  You may need this if you have certain conditions. Hepatitis A  vaccine  You may need this if you have certain conditions or if you travel or work in places where you may be exposed to hepatitis A. Hepatitis B vaccine  You may need this if you have certain conditions or if you travel or work in places where you may be exposed to hepatitis B. Haemophilus influenzae type b (Hib) vaccine  You may need this if you have certain risk factors. Human papillomavirus (HPV) vaccine  If recommended by your health care provider, you may need three doses over 6 months. You may receive vaccines as individual doses or as more than one vaccine together in one shot (combination vaccines). Talk with your health care provider about the risks and benefits of combination vaccines. What tests do I need? Blood tests  Lipid and cholesterol levels. These may be checked every 5 years, or more frequently if you are over 60 years old.  Hepatitis C test.  Hepatitis B test. Screening  Lung cancer screening. You may have this screening every year starting at age 43 if you have a 30-pack-year history of smoking and currently smoke or have quit within the past 15 years.  Prostate cancer screening. Recommendations will vary depending on your family history and other risks.  Colorectal cancer screening. All adults should have this screening starting at age 72 and continuing until age 2. Your health care provider may recommend screening at age 14 if you are at increased risk. You will have tests every 1-10 years, depending on your results and the type of screening test.  Diabetes screening. This is done by checking your blood sugar (glucose) after you have not eaten  for a while (fasting). You may have this done every 1-3 years.  Sexually transmitted disease (STD) testing. Follow these instructions at home: Eating and drinking  Eat a diet that includes fresh fruits and vegetables, whole grains, lean protein, and low-fat dairy products.  Take vitamin and mineral supplements as  recommended by your health care provider.  Do not drink alcohol if your health care provider tells you not to drink.  If you drink alcohol: ? Limit how much you have to 0-2 drinks a day. ? Be aware of how much alcohol is in your drink. In the [place] one drink equals one 12 oz bottle of beer (355 mL), one 5 oz glass of wine (148 mL), or one 1 oz glass of hard liquor (44 mL). Lifestyle  Take daily care of your teeth and gums.  Stay active. Exercise for at least 30 minutes on 5 or more days each week.  Do not use any products that contain nicotine or tobacco, such as cigarettes, e-cigarettes, and chewing tobacco. If you need help quitting, ask your health care provider.  If you are sexually active, practice safe sex. Use a condom or other form of protection to prevent STIs (sexually transmitted infections).  Talk with your health care provider about taking a low-dose aspirin every day starting at age 68. What's next?  Go to your health care provider once a year for a well check visit.  Ask your health care provider how often you should have your eyes and teeth checked.  Stay up to date on all vaccines. This information is not intended to replace advice given to you by your health care provider. Make sure you discuss any questions you have with your health care provider. Document Revised: 07/03/2018 Document Reviewed: 07/03/2018 Elsevier Patient Education  [place].  Preventing Unhealthy [employer], Adult Staying at a healthy weight is important to your overall health. When fat builds up in your body, you may become overweight or obese. Being overweight or obese increases your risk of developing certain health problems, such as heart disease, diabetes, sleeping problems, joint problems, and some types of cancer. Unhealthy weight gain is often the result of making unhealthy food choices or not getting enough exercise. You can make changes to your lifestyle to prevent obesity and  stay as healthy as possible. What nutrition changes can be made?   Eat only as much as your body needs. To do this: ? Pay attention to signs that you are hungry or full. Stop eating as soon as you feel full. ? If you feel hungry, try drinking water first before eating. Drink enough water so your urine is clear or pale yellow. ? Eat smaller portions. Pay attention to portion sizes when eating out. ? Look at serving sizes on food labels. Most foods contain more than one serving per container. ? Eat the recommended number of calories for your gender and activity level. For most active people, a daily total of 2,000 calories is appropriate. If you are trying to lose weight or are not very active, you may need to eat fewer calories. Talk with your health care provider or a diet and nutrition specialist (dietitian) about how many calories you need each day.  Choose healthy foods, such as: ? Fruits and vegetables. At each meal, try to fill at least half of your plate with fruits and vegetables. ? Whole grains, such as whole-wheat bread, brown rice, and quinoa. ? Lean meats, such as chicken or fish. ?  Other healthy proteins, such as beans, eggs, or tofu. ? Healthy fats, such as nuts, seeds, fatty fish, and olive oil. ? Low-fat or fat-free dairy products.  Check food labels, and avoid food and drinks that: ? Are high in calories. ? Have added sugar. ? Are high in sodium. ? Have saturated fats or trans fats.  Cook foods in healthier ways, such as by baking, broiling, or grilling.  Make a meal plan for the week, and shop with a grocery list to help you stay on track with your purchases. Try to avoid going to the grocery store when you are hungry.  When grocery shopping, try to shop around the outside of the store first, where the fresh foods are. Doing this helps you to avoid prepackaged foods, which can be high in sugar, salt (sodium), and fat. What lifestyle changes can be made?   Exercise  for 30 or more minutes on 5 or more days each week. Exercising may include brisk walking, yard work, biking, running, swimming, and team sports like basketball and soccer. Ask your health care provider which exercises are safe for you.  Do muscle-strengthening activities, such as lifting weights or using resistance bands, on 2 or more days a week.  Do not use any products that contain nicotine or tobacco, such as cigarettes and e-cigarettes. If you need help quitting, ask your health care provider.  Limit alcohol intake to no more than 1 drink a day for nonpregnant women and 2 drinks a day for men. One drink equals 12 oz of beer, 5 oz of wine, or 1 oz of hard liquor.  Try to get 7-9 hours of sleep each night. What other changes can be made?  Keep a food and activity journal to keep track of: ? What you ate and how many calories you had. Remember to count the calories in sauces, dressings, and side dishes. ? Whether you were active, and what exercises you did. ? Your calorie, weight, and activity goals.  Check your weight regularly. Track any changes. If you notice you have gained weight, make changes to your diet or activity routine.  Avoid taking weight-loss medicines or supplements. Talk to your health care provider before starting any new medicine or supplement.  Talk to your health care provider before trying any new diet or exercise plan. Why are these changes important? Eating healthy, staying active, and having healthy habits can help you to prevent obesity. Those changes also:  Help you manage stress and emotions.  Help you connect with friends and family.  Improve your self-esteem.  Improve your sleep.  Prevent long-term health problems. What can happen if changes are not made? Being obese or overweight can cause you to develop joint or bone problems, which can make it hard for you to stay active or do activities you enjoy. Being obese or overweight also puts stress on  your heart and lungs and can lead to health problems like diabetes, heart disease, and some cancers. Where to find more information Talk with your health care provider or a dietitian about healthy eating and healthy lifestyle choices. You may also find information from:  [place]S. Department of Agriculture, MyPlate: https://ball-collins.biz/  American Heart Association: www.heart.org  Centers for Disease Control and Prevention: FootballExhibition.com.br Summary  Staying at a healthy weight is important to your overall health. It helps you to prevent certain diseases and health problems, such as heart disease, diabetes, joint problems, sleep disorders, and some types of cancer.  Being obese or  overweight can cause you to develop joint or bone problems, which can make it hard for you to stay active or do activities you enjoy.  You can prevent unhealthy weight gain by eating a healthy diet, exercising regularly, not smoking, limiting alcohol, and getting enough sleep.  Talk with your health care provider or a dietitian for guidance about healthy eating and healthy lifestyle choices. This information is not intended to replace advice given to you by your health care provider. Make sure you discuss any questions you have with your health care provider. Document Revised: 07/12/2017 Document Reviewed: 08/15/2016 Elsevier Patient Education  2020 [employer].

## 2020-01-29 NOTE — Progress Notes (Signed)
Subjective:     Jim LIEBLER is a 44 y.o. male and is here for a comprehensive physical exam. The patient reports no problems.  Patient endorses slightly less stress at work.  Has been working from home but does a lot of sitting all day.  Patient at times rides his bike with his 2 sons.  Overall patient states he is doing well.  Requesting refills on lisinopril and simvastatin.  Social History   Socioeconomic History  . Marital status: Married    Spouse name: Not on file  . Number of children: Not on file  . Years of education: Not on file  . Highest education level: Not on file  Occupational History  . Not on file  Tobacco Use  . Smoking status: Former Smoker    Quit date: 07/23/2005    Years since quitting: 14.5  . Smokeless tobacco: Former Engineer, water and Sexual Activity  . Alcohol use: Yes    Alcohol/week: 2.0 standard drinks    Types: 1 Cans of beer, 1 Shots of liquor per week    Comment: maybe on a week  . Drug use: No  . Sexual activity: Yes    Partners: Female, Male  Other Topics Concern  . Not on file  Social History Narrative  . Not on file   Social Determinants of Health   Financial Resource Strain:   . Difficulty of Paying Living Expenses:   Food Insecurity:   . Worried About Programme researcher, broadcasting/film/video in the Last Year:   . Barista in the Last Year:   Transportation Needs:   . Freight forwarder (Medical):   Marland Kitchen Lack of Transportation (Non-Medical):   Physical Activity:   . Days of Exercise per Week:   . Minutes of Exercise per Session:   Stress:   . Feeling of Stress :   Social Connections:   . Frequency of Communication with Friends and Family:   . Frequency of Social Gatherings with Friends and Family:   . Attends Religious Services:   . Active Member of Clubs or Organizations:   . Attends Banker Meetings:   Marland Kitchen Marital Status:   Intimate Partner Violence:   . Fear of Current or Ex-Partner:   . Emotionally Abused:   Marland Kitchen  Physically Abused:   . Sexually Abused:    Health Maintenance  Topic Date Due  . Hepatitis C Screening  Never done  . COVID-19 Vaccine (1) Never done  . INFLUENZA VACCINE  02/21/2020  . TETANUS/TDAP  04/25/2025  . HIV Screening  Completed    The following portions of the patient's history were reviewed and updated as appropriate: allergies, current medications, past family history, past medical history, past social history, past surgical history and problem list.  Review of Systems A comprehensive review of systems was negative.   Objective:    BP 112/72 (BP Location: Left Arm, Patient Position: Sitting, Cuff Size: Large)   Pulse 96   Temp 98.1 F (36.7 C) (Oral)   Ht 5\' 10"  (1.778 m)   Wt (!) 324 lb 4 oz (147.1 kg)   SpO2 97%   BMI 46.53 kg/m  General appearance: alert, cooperative and no distress Head: Normocephalic, without obvious abnormality, atraumatic Eyes: conjunctivae/corneas clear. PERRL, EOM's intact. Fundi benign. Ears: normal TM's and external ear canals both ears Nose: Nares normal. Septum midline. Mucosa normal. No drainage or sinus tenderness. Throat: lips, mucosa, and tongue normal; teeth and gums normal Neck:  no adenopathy, no carotid bruit, no JVD, supple, symmetrical, trachea midline and thyroid not enlarged, symmetric, no tenderness/mass/nodules Lungs: clear to auscultation bilaterally Heart: regular rate and rhythm, S1, S2 normal, no murmur, click, rub or gallop Abdomen: soft, non-tender; bowel sounds normal; no masses,  no organomegaly Extremities: extremities normal, atraumatic, no cyanosis or edema Pulses: 2+ and symmetric Skin: Skin color, texture, turgor normal. No rashes or lesions Lymph nodes: Cervical, supraclavicular, and axillary nodes normal. Neurologic: Alert and oriented X 3, normal strength and tone. Normal symmetric reflexes. Normal coordination and gait    Assessment:    Healthy male exam.      Plan:     Anticipatory guidance  given including wearing seatbelts, smoke detectors in the home, increasing physical activity, increasing p.o. intake of water and vegetables. -Obtain labs -Given handout -Next CPE in 1 year See After Visit Summary for Counseling Recommendations    Essential hypertension  -controlled -continue current medications including lisinopril 10 mg doxazosin 8 mg -Patient encouraged to check BP regularly - Plan: Comprehensive metabolic panel, lisinopril (ZESTRIL) 10 MG tablet, doxazosin (CARDURA) 8 MG tablet  Hyperlipidemia, unspecified hyperlipidemia type  -Continue lifestyle modifications - Plan: Lipid panel, simvastatin (ZOCOR) 40 MG tablet  Weight gain -Lifestyle modifications encouraged.  Patient also advised to take breaks during his workday to get up and move. - Plan: TSH, T4, Free, Hemoglobin A1c  Anxiety -Stable -PHQ-9 score 3 -GAD-7 score 6 -Continue Zoloft - Plan: sertraline (ZOLOFT) 50 MG tablet  Follow-up as needed  Abbe Amsterdam, MD

## 2020-01-30 LAB — CBC WITH DIFFERENTIAL/PLATELET
Absolute Monocytes: 389 cells/uL (ref 200–950)
Basophils Absolute: 47 cells/uL (ref 0–200)
Basophils Relative: 0.8 %
Eosinophils Absolute: 177 cells/uL (ref 15–500)
Eosinophils Relative: 3 %
HCT: 45.1 % (ref 38.5–50.0)
Hemoglobin: 14.7 g/dL (ref 13.2–17.1)
Lymphs Abs: 2136 cells/uL (ref 850–3900)
MCH: 29.3 pg (ref 27.0–33.0)
MCHC: 32.6 g/dL (ref 32.0–36.0)
MCV: 89.8 fL (ref 80.0–100.0)
MPV: 9 fL (ref 7.5–12.5)
Monocytes Relative: 6.6 %
Neutro Abs: 3151 cells/uL (ref 1500–7800)
Neutrophils Relative %: 53.4 %
Platelets: 301 10*3/uL (ref 140–400)
RBC: 5.02 10*6/uL (ref 4.20–5.80)
RDW: 12.6 % (ref 11.0–15.0)
Total Lymphocyte: 36.2 %
WBC: 5.9 10*3/uL (ref 3.8–10.8)

## 2020-01-30 LAB — T4, FREE: Free T4: 0.9 ng/dL (ref 0.8–1.8)

## 2020-04-21 DIAGNOSIS — Z23 Encounter for immunization: Secondary | ICD-10-CM | POA: Diagnosis not present

## 2020-09-26 ENCOUNTER — Other Ambulatory Visit: Payer: Self-pay | Admitting: Family Medicine

## 2020-09-26 DIAGNOSIS — F419 Anxiety disorder, unspecified: Secondary | ICD-10-CM

## 2021-01-23 ENCOUNTER — Other Ambulatory Visit: Payer: Self-pay | Admitting: Family Medicine

## 2021-01-23 DIAGNOSIS — I10 Essential (primary) hypertension: Secondary | ICD-10-CM

## 2021-03-23 ENCOUNTER — Other Ambulatory Visit: Payer: Self-pay | Admitting: Family Medicine

## 2021-03-23 DIAGNOSIS — I10 Essential (primary) hypertension: Secondary | ICD-10-CM

## 2021-03-23 DIAGNOSIS — E785 Hyperlipidemia, unspecified: Secondary | ICD-10-CM

## 2021-03-23 DIAGNOSIS — F419 Anxiety disorder, unspecified: Secondary | ICD-10-CM

## 2021-04-03 ENCOUNTER — Encounter: Payer: Self-pay | Admitting: Family Medicine

## 2021-04-03 ENCOUNTER — Other Ambulatory Visit: Payer: Self-pay

## 2021-04-03 ENCOUNTER — Ambulatory Visit: Payer: BC Managed Care – PPO | Admitting: Family Medicine

## 2021-04-03 VITALS — BP 148/86 | HR 111 | Temp 98.1°F | Wt 330.0 lb

## 2021-04-03 DIAGNOSIS — I10 Essential (primary) hypertension: Secondary | ICD-10-CM

## 2021-04-03 DIAGNOSIS — T7840XA Allergy, unspecified, initial encounter: Secondary | ICD-10-CM

## 2021-04-03 DIAGNOSIS — Z9109 Other allergy status, other than to drugs and biological substances: Secondary | ICD-10-CM

## 2021-04-03 DIAGNOSIS — L2389 Allergic contact dermatitis due to other agents: Secondary | ICD-10-CM

## 2021-04-03 MED ORDER — EPINEPHRINE 0.3 MG/0.3ML IJ SOAJ
0.3000 mg | INTRAMUSCULAR | 1 refills | Status: AC | PRN
Start: 2021-04-03 — End: ?

## 2021-04-03 MED ORDER — PREDNISONE 10 MG PO TABS: ORAL_TABLET | ORAL | 0 refills | Status: DC

## 2021-04-03 NOTE — Progress Notes (Signed)
Subjective:    Patient ID: Jim Goodwin, male    DOB: 1976/03/10, 45 y.o.   MRN: [id number]  Chief Complaint  Patient presents with   Allergic Reaction    Last Saturday was cutting pen blanks and noticed the rash on arms, sides, legs. Has stopped itching constantly    HPI Patient was seen today for acute concern.  Patient has known history of allergy to specific types of wood.  Pt does woodworking creating wood pens.  Pt was making pens last Sunday with a type of wood he thought he was not allergic to, then developed a pruritic rash on upper extremities and chest the next day.  Patient taking Benadryl 50 mg twice daily and Tagamet daily.  Some improvement in rash as it is less pruritic.  Also tried using Camphor and alcohol gel which seemed to increase pruritus.    Pt not checking BP.  Working from home and sitting majority of the day.  Taking Cardura and lisinopril eating cereal for breakfast and peanut butter and jelly sandwich for lunch.  Was biking until it became too hot over the summer.  Denies head aches, CP, changes in vision, edema.  Past Medical History:  Diagnosis Date   Allergy    Arthritis    Hyperlipidemia    Hypertension    Nephrolithiasis     Allergies  Allergen Reactions   Duracillin As [Penicillin G Procaine]     GI upset    ROS General: Denies fever, chills, night sweats, changes in weight, changes in appetite HEENT: Denies headaches, ear pain, changes in vision, rhinorrhea, sore throat CV: Denies CP, palpitations, SOB, orthopnea Pulm: Denies SOB, cough, wheezing GI: Denies abdominal pain, nausea, vomiting, diarrhea, constipation GU: Denies dysuria, hematuria, frequency Msk: Denies muscle cramps, joint pains Neuro: Denies weakness, numbness, tingling Skin: Denies bruising  + rash on bilateral forearms and chest, pruritus Psych: Denies depression, anxiety, hallucinations     Objective:    Blood pressure (!) 148/86, pulse (!) 111, temperature 98.1 F  (36.7 C), temperature source Oral, weight (!) 330 lb (149.7 kg), SpO2 96 %.  Gen. Pleasant, well-nourished, in no distress, normal affect   HEENT: [place]/AT, face symmetric, conjunctiva clear, no scleral icterus, PERRLA, EOMI, nares patent without drainage Lungs: no accessory muscle use Cardiovascular: Tachycardia, no peripheral edema Musculoskeletal: No deformities, no cyanosis or clubbing, normal tone Neuro:  A&Ox3, CN II-XII intact, normal gait Skin:  Warm, dry, intact.  Pruritic fine papules creating plaques on bilateral upper extremities medially, right flank and abdomen with sporadic areas on left upper abdomen.   Wt Readings from Last 3 Encounters:  04/03/21 (!) 330 lb (149.7 kg)  01/29/20 (!) 324 lb 4 oz (147.1 kg)  08/13/18 (!) 315 lb (142.9 kg)    Lab Results  Component Value Date   WBC 5.9 01/29/2020   HGB 14.7 01/29/2020   HCT 45.1 01/29/2020   PLT 301 01/29/2020   GLUCOSE 88 08/13/2018   CHOL 164 08/13/2018   TRIG 117.0 08/13/2018   HDL 43.30 08/13/2018   LDLCALC 97 08/13/2018   ALT 25 08/13/2017   AST 19 08/13/2017   NA 139 08/13/2018   K 4.7 08/13/2018   CL 101 08/13/2018   CREATININE 1.01 08/13/2018   BUN 13 08/13/2018   CO2 31 08/13/2018   TSH 1.47 08/13/2017   HGBA1C 5.6 08/13/2018    Assessment/Plan:  Allergic contact dermatitis due to other agents -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  When doing woodwork. -Okay to continue Tagamet daily and Benadryl as needed -We will start prednisone taper -Given strict precautions  - Plan: predniSONE (DELTASONE) 10 MG tablet  Allergic reaction, initial encounter  - Plan: EPINEPHrine 0.3 mg/0.3 mL IJ SOAJ injection, predniSONE (DELTASONE) 10 MG tablet  Essential hypertension -Uncontrolled -Discussed the importance of lifestyle modifications -Patient encouraged to check BP at home and keep a log to bring to clinic -Continue  current medications including lisinopril 10 mg and Cardura 8 mg -Patient follow-up in 1 month for labs and BP check.  For continued elevation will increase medications.  F/u in 1 month, sooner if needed  Abbe Amsterdam, MD

## 2021-04-24 ENCOUNTER — Other Ambulatory Visit: Payer: Self-pay | Admitting: Family Medicine

## 2021-04-24 DIAGNOSIS — I10 Essential (primary) hypertension: Secondary | ICD-10-CM

## 2021-05-02 ENCOUNTER — Other Ambulatory Visit: Payer: Self-pay

## 2021-05-03 ENCOUNTER — Encounter: Payer: Self-pay | Admitting: Family Medicine

## 2021-05-03 ENCOUNTER — Ambulatory Visit: Payer: BC Managed Care – PPO | Admitting: Family Medicine

## 2021-05-03 VITALS — BP 138/78 | HR 93 | Temp 98.1°F | Wt 328.6 lb

## 2021-05-03 DIAGNOSIS — I10 Essential (primary) hypertension: Secondary | ICD-10-CM

## 2021-05-03 DIAGNOSIS — Z9109 Other allergy status, other than to drugs and biological substances: Secondary | ICD-10-CM

## 2021-05-03 DIAGNOSIS — Z6841 Body Mass Index (BMI) 40.0 and over, adult: Secondary | ICD-10-CM | POA: Diagnosis not present

## 2021-05-03 MED ORDER — LISINOPRIL 20 MG PO TABS: 20.0000 mg | ORAL_TABLET | Freq: Every day | ORAL | 3 refills | Status: DC

## 2021-05-03 NOTE — Progress Notes (Signed)
Subjective:    Patient ID: Jim Goodwin, male    DOB: 30-Apr-1976, 45 y.o.   MRN: [id number]  Chief Complaint  Patient presents with   Follow-up    BP     HPI Patient was seen today for follow-up.  Patient notes some improvement in BP since last OFV.  Taking lisinopril 10 mg and Cardura 8 mg daily.  Average BP 131/83, highest 139/90.  Denies headaches, changes in vision, CP, LE edema.  Eating a low-sodium diet and trying to increase intake of water.  Patient notes improvement in skin after contact dermatitis 2/2 wood allergy.  Pt finished prednisone taper.  Pt working to decrease wood dust in work space, makes Animator.  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    ROS General: Denies fever, chills, night sweats, changes in weight, changes in appetite HEENT: Denies headaches, ear pain, changes in vision, rhinorrhea, sore throat CV: Denies CP, palpitations, SOB, orthopnea Pulm: Denies SOB, cough, wheezing GI: Denies abdominal pain, nausea, vomiting, diarrhea, constipation GU: Denies dysuria, hematuria, frequency Msk: Denies muscle cramps, joint pains Neuro: Denies weakness, numbness, tingling Skin: Denies rashes, bruising Psych: Denies depression, anxiety, hallucinations  Objective:    Blood pressure 138/78, pulse 93, temperature 98.1 F (36.7 C), temperature source Oral, weight (!) 328 lb 9.6 oz (149.1 kg), SpO2 98 %.  Gen. Pleasant, well-nourished, in no distress, normal affect   HEENT: [place]/AT, face symmetric, conjunctiva clear, no scleral icterus, PERRLA, EOMI, nares patent without drainage Lungs: no accessory muscle use Cardiovascular: RRR, no peripheral edema Musculoskeletal: No deformities, no  cyanosis or clubbing, normal tone Neuro:  A&Ox3, CN II-XII intact, normal gait Skin:  Warm, no lesions/ rash   Wt Readings from Last 3 Encounters:  05/03/21 (!) 328 lb 9.6 oz (149.1 kg)  04/03/21 (!) 330 lb (149.7 kg)  01/29/20 (!) 324 lb 4 oz (147.1 kg)    Lab Results  Component Value Date   WBC 5.9 01/29/2020   HGB 14.7 01/29/2020   HCT 45.1 01/29/2020   PLT 301 01/29/2020   GLUCOSE 88 08/13/2018   CHOL 164 08/13/2018   TRIG 117.0 08/13/2018   HDL 43.30 08/13/2018   LDLCALC 97 08/13/2018   ALT 25 08/13/2017   AST 19 08/13/2017   NA 139 08/13/2018   K 4.7 08/13/2018   CL 101 08/13/2018   CREATININE 1.01 08/13/2018   BUN 13 08/13/2018   CO2 31 08/13/2018   TSH 1.47 08/13/2017   HGBA1C 5.6 08/13/2018    Assessment/Plan:  Essential hypertension  -Improving -We will increase lisinopril from 10 mg daily to 20 mg daily. 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

## 2021-08-07 ENCOUNTER — Telehealth: Payer: BC Managed Care – PPO | Admitting: Nurse Practitioner

## 2021-08-07 DIAGNOSIS — L089 Local infection of the skin and subcutaneous tissue, unspecified: Secondary | ICD-10-CM | POA: Diagnosis not present

## 2021-08-07 DIAGNOSIS — B49 Unspecified mycosis: Secondary | ICD-10-CM | POA: Diagnosis not present

## 2021-08-07 MED ORDER — SULFAMETHOXAZOLE-TRIMETHOPRIM 800-160 MG PO TABS: 1.0000 | ORAL_TABLET | Freq: Two times a day (BID) | ORAL | 0 refills | Status: AC

## 2021-08-07 MED ORDER — NYSTATIN 100000 UNIT/GM EX POWD: 1.0000 "application " | Freq: Three times a day (TID) | CUTANEOUS | 0 refills | Status: DC

## 2021-08-07 NOTE — Progress Notes (Signed)
Virtual Visit Consent   [name], you are scheduled for a virtual visit with a [facility] provider today.     Just as with appointments in the office, your consent must be obtained to participate.  Your consent will be active for this visit and any virtual visit you may have with one of our providers in the next 365 days.     If you have a MyChart account, a copy of this consent can be sent to you electronically.  All virtual visits are billed to your insurance company just like a traditional visit in the office.    As this is a virtual visit, video technology does not allow for your provider to perform a traditional examination.  This may limit your provider's ability to fully assess your condition.  If your provider identifies any concerns that need to be evaluated in person or the need to arrange testing (such as labs, EKG, etc.), we will make arrangements to do so.     Although advances in technology are sophisticated, we cannot ensure that it will always work on either your end or our end.  If the connection with a video visit is poor, the visit may have to be switched to a telephone visit.  With either a video or telephone visit, we are not always able to ensure that we have a secure connection.     I need to obtain your verbal consent now.   Are you willing to proceed with your visit today?    Diron DUGAN VANHOESEN has provided verbal consent on 08/07/2021 for a virtual visit (video or telephone).   Viviano Simas, FNP   Date: 08/07/2021 6:56 PM   Virtual Visit via Video Note   I, Viviano Simas, connected with  Jim Goodwin  ([id number], [birth year]) on 08/07/21 at  7:00 PM EST by a video-enabled telemedicine application and verified that I am speaking with the correct person using two identifiers.  Location: Patient: Virtual Visit Location Patient: Home Provider: Virtual Visit Location Provider: Home Office   I discussed the limitations of evaluation and management by  telemedicine and the availability of in person appointments. The patient expressed understanding and agreed to proceed.    History of Present Illness: Jim Goodwin is a 46 y.o. who identifies as a male who was assigned male at birth, and is being seen today for a red patch on his abdomen that is warm to touch and tender. He denies any ingrown hair or bug bite. He does have some sores in the area under his abdominal fold.   He feels a little chilled with a 101 fever as well.   Denies a history of skin yeast or fungal infections.   Denies any abdominal pain or digestive symptoms.   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   Medications:  Current  Outpatient Medications:    aspirin 81 MG EC tablet, Take 81 mg by mouth daily.  , Disp: , Rfl:    cetirizine (ZYRTEC) 10 MG tablet, Take 10 mg by mouth daily.  , Disp: , Rfl:    doxazosin (CARDURA) 8 MG tablet, TAKE 1 TABLET AT BEDTIME, Disp: 90 tablet, Rfl: 3   EPINEPHrine 0.3 mg/0.3 mL IJ SOAJ injection, Inject 0.3 mg into the muscle as needed for anaphylaxis., Disp: 1 each, Rfl: 1   fluticasone (FLONASE) 50 MCG/ACT nasal spray, Place 1 spray into both nostrils daily. As needed for additional allergy relief., Disp: 32 g, Rfl: 6   lisinopril (ZESTRIL) 20 MG tablet, Take 1 tablet (20 mg total) by mouth daily., Disp: 90 tablet, Rfl: 3   LORazepam (ATIVAN) 1 MG tablet,  One half to one tablet one hour prior to flying, Disp: 20 tablet, Rfl: 1   sertraline (ZOLOFT) 50 MG tablet, TAKE 1 TABLET DAILY, Disp: 90 tablet, Rfl: 3   simvastatin (ZOCOR) 40 MG tablet, TAKE 1 TABLET AT BEDTIME, Disp: 90 tablet, Rfl: 3  Observations/Objective: Patient is well-developed, well-nourished in no acute distress.  Resting comfortably at home.  Head is normocephalic, atraumatic.  No labored breathing.  Speech is clear and coherent with logical content.  Patient is alert and oriented at baseline.  Redness to under portion of abdominal fold, appears shiny as well with small sores present   Assessment and Plan: 1. Skin infection 2. Fungal infection Meds ordered this encounter  Medications   sulfamethoxazole-trimethoprim (BACTRIM DS) 800-160 MG tablet    Sig: Take 1 tablet by mouth 2 (two) times daily for 10 days.    Dispense:  20 tablet    Refill:  0   nystatin (MYCOSTATIN/NYSTOP) powder    Sig: Apply 1 application topically 3 (three) times daily.    Dispense:  15 g    Refill:  0        Follow Up Instructions: I discussed the assessment and treatment plan with the patient. The patient was provided an opportunity to ask questions and all were answered. The patient agreed with the plan and demonstrated an understanding of the instructions.  A copy of instructions were sent to the patient via MyChart unless otherwise noted below.    The patient was advised to call back or seek an in-person evaluation if the symptoms worsen or if the condition fails to improve as anticipated.  Time:  I spent 10 minutes with the patient via telehealth technology discussing the above problems/concerns.    Viviano Simas, FNP

## 2022-01-31 ENCOUNTER — Encounter: Payer: Self-pay | Admitting: Family Medicine

## 2022-01-31 ENCOUNTER — Ambulatory Visit: Payer: BC Managed Care – PPO | Admitting: Family Medicine

## 2022-01-31 DIAGNOSIS — F4010 Social phobia, unspecified: Secondary | ICD-10-CM

## 2022-01-31 DIAGNOSIS — E785 Hyperlipidemia, unspecified: Secondary | ICD-10-CM | POA: Diagnosis not present

## 2022-01-31 DIAGNOSIS — I10 Essential (primary) hypertension: Secondary | ICD-10-CM

## 2022-01-31 DIAGNOSIS — F419 Anxiety disorder, unspecified: Secondary | ICD-10-CM | POA: Diagnosis not present

## 2022-01-31 MED ORDER — LISINOPRIL 20 MG PO TABS: 20.0000 mg | ORAL_TABLET | Freq: Every day | ORAL | 3 refills | Status: DC

## 2022-01-31 MED ORDER — LORAZEPAM 1 MG PO TABS: ORAL_TABLET | ORAL | 1 refills | Status: AC

## 2022-01-31 MED ORDER — DOXAZOSIN MESYLATE 8 MG PO TABS: 8.0000 mg | ORAL_TABLET | Freq: Every day | ORAL | 3 refills | Status: DC

## 2022-01-31 MED ORDER — SIMVASTATIN 40 MG PO TABS: 40.0000 mg | ORAL_TABLET | Freq: Every day | ORAL | 3 refills | Status: DC

## 2022-01-31 MED ORDER — SERTRALINE HCL 50 MG PO TABS: 50.0000 mg | ORAL_TABLET | Freq: Every day | ORAL | 3 refills | Status: DC

## 2022-01-31 NOTE — Progress Notes (Signed)
Subjective:    Patient ID: Jim Goodwin, male    DOB: Aug 23, 1975, 46 y.o.   MRN: [id number]  Chief Complaint  Patient presents with   Hypertension    Patient reports his BP been ranging from 115 to 132 over 75-89.    Medication Refill    On Ativan    HPI Patient was seen today for f/u.  Pt doing well.  Staying busy with work.  Has several upcoming projects that require him to travel.  Taking a half tab Ativan 1 mg for anxiety with flying.  States bp at home 115/75-132/89.  Pt was walking the family dog regularly for exercise, but has been doing less because it has been so hot.    Patient has not had any allergic reactions since last visit.  Using a different respirator type mask while doing woodwork.  At most noticed mild pruritus with certain types of wood.  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  Has EpiPen.  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    ROS General: Denies fever, chills, night sweats, changes in weight, changes in appetite HEENT: Denies headaches, ear pain, changes in vision, rhinorrhea, sore throat CV: Denies CP, palpitations, SOB, orthopnea Pulm: Denies SOB, cough, wheezing GI: Denies abdominal pain, nausea, vomiting, diarrhea, constipation GU: Denies dysuria, hematuria, frequency Msk: Denies muscle cramps, joint pains Neuro: Denies weakness, numbness, tingling Skin: Denies rashes, bruising Psych: Denies depression, anxiety, hallucinations     Objective:    Blood pressure 130/82, pulse 85, temperature 98.5 F (36.9 C), temperature source Oral, height 5\' 9"  (1.753 m), weight (!) 328 lb 9.6 oz (149.1 kg), SpO2 97 %.  Gen.  Pleasant, well-nourished, in no distress, normal affect   HEENT: [place]/AT, face symmetric, conjunctiva clear, no scleral icterus, PERRLA, EOMI, nares patent without drainage Lungs: no accessory muscle use, CTAB, no wheezes or rales Cardiovascular: RRR, no m/r/g, no peripheral edema Musculoskeletal: No deformities, no cyanosis or clubbing, normal tone Neuro:  A&Ox3, CN II-XII intact, normal gait Skin:  Warm, no lesions/ rash   Wt Readings from Last 3 Encounters:  01/31/22 (!) 328 lb 9.6 oz (149.1 kg)  05/03/21 (!) 328 lb 9.6 oz (149.1 kg)  04/03/21 (!) 330 lb (149.7 kg)    Lab Results  Component Value Date   WBC 5.9 01/29/2020   HGB 14.7 01/29/2020   HCT 45.1 01/29/2020   PLT 301 01/29/2020   GLUCOSE 88 08/13/2018   CHOL 164 08/13/2018   TRIG 117.0 08/13/2018   HDL 43.30 08/13/2018   LDLCALC 97 08/13/2018   ALT 25 08/13/2017   AST 19 08/13/2017   NA 139 08/13/2018   K 4.7 08/13/2018   CL 101 08/13/2018   CREATININE 1.01 08/13/2018   BUN 13 08/13/2018   CO2 31 08/13/2018   TSH 1.47 08/13/2017   HGBA1C 5.6 08/13/2018      01/31/2022    4:30 PM 01/29/2020    8:38 AM 01/29/2020    8:37 AM  Depression screen PHQ 2/9  Decreased Interest 1 0 0  Down, Depressed, Hopeless  1 0 0  PHQ - 2 Score 2 0 0  Altered sleeping 1 1   Tired, decreased energy 0 1   Change in appetite 0 0   Feeling bad or failure about yourself  1 1   Trouble concentrating 0 0   Moving slowly or fidgety/restless 0 0   Suicidal thoughts 0 0   PHQ-9 Score 4 3   Difficult doing work/chores Not difficult at all Not difficult at all     Assessment/Plan:  Social anxiety disorder -Anxiety with flying -Ativan refilled as patient uses sparingly. -Continue half tab Ativan 1 mg prior to flying  - Plan: LORazepam (ATIVAN) 1 MG tablet  Essential hypertension  -Better control since increasing lisinopril to 20 mg -Continue lisinopril 20 mg daily -Continue lifestyle modifications.  Continue walking/exercising  throughout the week - Plan: doxazosin (CARDURA) 8 MG tablet, lisinopril (ZESTRIL) 20 MG tablet  Hyperlipidemia, unspecified hyperlipidemia type  -Continue lifestyle modifications - Plan: simvastatin (ZOCOR) 40 MG tablet  Anxiety -Stable -Continue Zoloft 50 mg daily  - Plan: sertraline (ZOLOFT) 50 MG tablet  F/u prn in the next few months for CPE.  Abbe Amsterdam, MD

## 2022-03-14 ENCOUNTER — Encounter: Payer: Self-pay | Admitting: Family Medicine

## 2022-03-14 ENCOUNTER — Ambulatory Visit: Payer: BC Managed Care – PPO | Admitting: Family Medicine

## 2022-03-14 VITALS — BP 136/82 | HR 100 | Temp 98.9°F | Wt 330.4 lb

## 2022-03-14 DIAGNOSIS — R051 Acute cough: Secondary | ICD-10-CM

## 2022-03-14 DIAGNOSIS — J40 Bronchitis, not specified as acute or chronic: Secondary | ICD-10-CM | POA: Diagnosis not present

## 2022-03-14 DIAGNOSIS — R490 Dysphonia: Secondary | ICD-10-CM | POA: Diagnosis not present

## 2022-03-14 MED ORDER — PREDNISONE 20 MG PO TABS: 40.0000 mg | ORAL_TABLET | Freq: Every day | ORAL | 0 refills | Status: AC

## 2022-03-14 MED ORDER — AZITHROMYCIN 250 MG PO TABS: ORAL_TABLET | ORAL | 0 refills | Status: AC

## 2022-03-14 MED ORDER — BENZONATATE 100 MG PO CAPS: 100.0000 mg | ORAL_CAPSULE | Freq: Two times a day (BID) | ORAL | 0 refills | Status: DC | PRN

## 2022-03-14 NOTE — Patient Instructions (Addendum)
Prescriptions for azithromycin, prednisone, and Tessalon to help with your cough were sent to your pharmacy.  You can still use the over-the-counter cough/cold medications and allergy medicine along with these.    You can use saline nasal rinse or Ayr nasal gel to provide moisture in her nose.  These can both be done over-the-counter at your local drugstore.

## 2022-03-14 NOTE — Progress Notes (Signed)
Subjective:    Patient ID: Jim Goodwin, male    DOB: 07-23-1976, 46 y.o.   MRN: [id number]  Chief Complaint  Patient presents with   Cough    For the past 3 weeks, since came from [place]. Sometimes is productive, mainly clear, some pale yellow. Took some coricidin cold and flu, coricidin cough alternating. After the 1st wk tried not taking it during the day, mostly took it at night.     HPI Patient was seen today for ongoing concern.  Patient endorses cough x3 weeks since returning from a trip to [place].  Patient states symptoms initially started with sore throat like strep.  Now with intermittent drainage, intermittently productive cough, hoarseness in voice, intermittent loose stools, decreased appetite.  Patient denies fever, headache, ear pain/pressure, facial pain/pressure, N/V.  Taking Coricidin for symptoms.  Pt's wife and kids had cold like sx x few days.  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    ROS General: Denies fever, chills, night sweats, changes in weight, changes in appetite HEENT: Denies headaches, ear pain, changes in vision, rhinorrhea +sore throat, hoarse voice, intermittent drainage CV: Denies CP, palpitations, SOB, orthopnea Pulm: Denies SOB, wheezing +cough GI: Denies abdominal pain, nausea, vomiting, diarrhea, constipation GU: Denies dysuria, hematuria, frequency, vaginal discharge Msk: Denies muscle cramps, joint pains Neuro: Denies weakness, numbness, tingling Skin: Denies rashes, bruising Psych: Denies depression, anxiety, hallucinations     Objective:    Blood pressure 136/82, pulse 100, temperature 98.9 F (37.2 C), temperature source Oral, weight (!) 330 lb 6.4 oz (149.9  kg), SpO2 96 %.  Gen. Pleasant, well-nourished, in no distress, normal affect  HEENT: [place]/AT, face symmetric, conjunctiva clear, no scleral icterus, PERRLA, EOMI, nares patent without drainage, pharynx with erythema, no exudate.  TMs full b/l. Lungs: dry cough, no accessory muscle use, CTAB, no wheezes or rales Cardiovascular: RRR, no m/r/g, no peripheral edema Musculoskeletal: No deformities, no cyanosis or clubbing, normal tone Neuro:  A&Ox3, CN II-XII intact, normal gait Skin:  Warm, no lesions/ rash   Wt Readings from Last 3 Encounters:  03/14/22 (!) 330 lb 6.4 oz (149.9 kg)  01/31/22 (!) 328 lb 9.6 oz (149.1 kg)  05/03/21 (!) 328 lb 9.6 oz (149.1 kg)    Lab Results  Component Value Date   WBC 5.9 01/29/2020   HGB 14.7 01/29/2020   HCT 45.1 01/29/2020   PLT 301 01/29/2020   GLUCOSE 88 08/13/2018   CHOL 164 08/13/2018   TRIG 117.0 08/13/2018   HDL 43.30 08/13/2018   LDLCALC 97 08/13/2018   ALT 25 08/13/2017   AST 19 08/13/2017   NA 139 08/13/2018   K 4.7 08/13/2018   CL 101 08/13/2018   CREATININE 1.01 08/13/2018   BUN 13 08/13/2018   CO2 31 08/13/2018   TSH 1.47 08/13/2017   HGBA1C 5.6 08/13/2018    Assessment/Plan:  Bronchitis  - Plan: benzonatate (TESSALON) 100 MG capsule, azithromycin (ZITHROMAX) 250 MG tablet, predniSONE (DELTASONE) 20 MG tablet  Acute cough  - Plan: predniSONE (DELTASONE) 20 MG tablet  Voice hoarseness -supportive care including vocal rest, gargling with warm salt water or Chloraseptic  spray. -Okay to continue OTC antihistamines  F/u prn  Abbe Amsterdam, MD

## 2023-02-18 ENCOUNTER — Other Ambulatory Visit: Payer: Self-pay | Admitting: Family Medicine

## 2023-02-18 DIAGNOSIS — E785 Hyperlipidemia, unspecified: Secondary | ICD-10-CM

## 2023-02-18 DIAGNOSIS — F419 Anxiety disorder, unspecified: Secondary | ICD-10-CM

## 2023-02-18 DIAGNOSIS — I10 Essential (primary) hypertension: Secondary | ICD-10-CM

## 2023-03-13 ENCOUNTER — Other Ambulatory Visit: Payer: Self-pay | Admitting: Family Medicine

## 2023-03-13 DIAGNOSIS — I10 Essential (primary) hypertension: Secondary | ICD-10-CM

## 2023-05-08 ENCOUNTER — Ambulatory Visit (INDEPENDENT_AMBULATORY_CARE_PROVIDER_SITE_OTHER): Payer: BC Managed Care – PPO | Admitting: Family Medicine

## 2023-05-08 ENCOUNTER — Encounter: Payer: Self-pay | Admitting: Family Medicine

## 2023-05-08 VITALS — BP 132/80 | HR 85 | Temp 98.2°F | Ht 69.0 in | Wt 339.2 lb

## 2023-05-08 DIAGNOSIS — Z0001 Encounter for general adult medical examination with abnormal findings: Secondary | ICD-10-CM | POA: Diagnosis not present

## 2023-05-08 DIAGNOSIS — R051 Acute cough: Secondary | ICD-10-CM

## 2023-05-08 DIAGNOSIS — E785 Hyperlipidemia, unspecified: Secondary | ICD-10-CM

## 2023-05-08 DIAGNOSIS — F419 Anxiety disorder, unspecified: Secondary | ICD-10-CM | POA: Diagnosis not present

## 2023-05-08 DIAGNOSIS — I10 Essential (primary) hypertension: Secondary | ICD-10-CM | POA: Diagnosis not present

## 2023-05-08 DIAGNOSIS — Z1211 Encounter for screening for malignant neoplasm of colon: Secondary | ICD-10-CM

## 2023-05-08 DIAGNOSIS — Z1159 Encounter for screening for other viral diseases: Secondary | ICD-10-CM

## 2023-05-08 LAB — COMPREHENSIVE METABOLIC PANEL
ALT: 29 U/L (ref 0–53)
AST: 22 U/L (ref 0–37)
Albumin: 4.2 g/dL (ref 3.5–5.2)
Alkaline Phosphatase: 44 U/L (ref 39–117)
BUN: 11 mg/dL (ref 6–23)
CO2: 28 meq/L (ref 19–32)
Calcium: 9.2 mg/dL (ref 8.4–10.5)
Chloride: 103 meq/L (ref 96–112)
Creatinine, Ser: 0.83 mg/dL (ref 0.40–1.50)
GFR: 104.56 mL/min (ref >60.00)
Glucose, Bld: 86 mg/dL (ref 70–99)
Potassium: 4.3 meq/L (ref 3.5–5.1)
Sodium: 139 meq/L (ref 135–145)
Total Bilirubin: 0.5 mg/dL (ref 0.2–1.2)
Total Protein: 7 g/dL (ref 6.0–8.3)

## 2023-05-08 LAB — CBC WITH DIFFERENTIAL/PLATELET
Basophils Absolute: 0 10*3/uL (ref 0.0–0.1)
Basophils Relative: 0.6 % (ref 0.0–3.0)
Eosinophils Absolute: 0.2 10*3/uL (ref 0.0–0.7)
Eosinophils Relative: 2.5 % (ref 0.0–5.0)
HCT: 44.2 % (ref 39.0–52.0)
Hemoglobin: 14.6 g/dL (ref 13.0–17.0)
Lymphocytes Relative: 36.2 % (ref 12.0–46.0)
Lymphs Abs: 2.3 10*3/uL (ref 0.7–4.0)
MCHC: 32.9 g/dL (ref 30.0–36.0)
MCV: 90.2 fL (ref 78.0–100.0)
Monocytes Absolute: 0.4 10*3/uL (ref 0.1–1.0)
Monocytes Relative: 6.8 % (ref 3.0–12.0)
Neutro Abs: 3.5 10*3/uL (ref 1.4–7.7)
Neutrophils Relative %: 53.9 % (ref 43.0–77.0)
Platelets: 300 10*3/uL (ref 150.0–400.0)
RBC: 4.9 Mil/uL (ref 4.22–5.81)
RDW: 13.1 % (ref 11.5–15.5)
WBC: 6.5 10*3/uL (ref 4.0–10.5)

## 2023-05-08 LAB — HEMOGLOBIN A1C: Hgb A1c MFr Bld: 5.7 % (ref 4.6–6.5)

## 2023-05-08 LAB — LIPID PANEL
Cholesterol: 146 mg/dL (ref 0–200)
HDL: 41.9 mg/dL (ref >39.00)
LDL Cholesterol: 88 mg/dL (ref 0–99)
NonHDL: 103.94
Total CHOL/HDL Ratio: 3
Triglycerides: 80 mg/dL (ref 0.0–149.0)
VLDL: 16 mg/dL (ref 0.0–40.0)

## 2023-05-08 LAB — TSH: TSH: 1.49 u[IU]/mL (ref 0.35–5.50)

## 2023-05-08 MED ORDER — LISINOPRIL 20 MG PO TABS
20.0000 mg | ORAL_TABLET | Freq: Every day | ORAL | 3 refills | Status: DC
Start: 2023-05-08 — End: 2024-04-14

## 2023-05-08 MED ORDER — SERTRALINE HCL 50 MG PO TABS
50.0000 mg | ORAL_TABLET | Freq: Every day | ORAL | 3 refills | Status: DC
Start: 2023-05-08 — End: 2024-05-06

## 2023-05-08 MED ORDER — SIMVASTATIN 40 MG PO TABS
40.0000 mg | ORAL_TABLET | Freq: Every day | ORAL | 3 refills | Status: DC
Start: 2023-05-08 — End: 2024-05-06

## 2023-05-08 MED ORDER — DOXAZOSIN MESYLATE 8 MG PO TABS
8.0000 mg | ORAL_TABLET | Freq: Every day | ORAL | 1 refills | Status: DC
Start: 2023-05-08 — End: 2023-11-04

## 2023-05-08 NOTE — Addendum Note (Signed)
Addended by: Clearnce Sorrel on: 05/08/2023 03:10 PM   Modules accepted: Orders

## 2023-05-08 NOTE — Progress Notes (Signed)
Established Patient Office Visit   Subjective  Patient ID: Jim Goodwin, male    DOB: [birth year]  Age: 47 y.o. MRN: [id number]  Chief Complaint  Patient presents with   Annual Exam    Patient is a 47 year old male seen for CPE.  Patient states he is doing well overall.  Has a lingering cough s/p COVID-19 infection mid-September.  Taking meds OTC cough drops.  Will take Safetussin at night for really bad cough.  Patient states allergies are also contributing to symptoms.  Mold from weeks/change of season aggravating allergies.  Patient states he sounds worse than what he feels.  Taking Zyrtec.  Requesting refills on several medications including Cardura, lisinopril, Zoloft, Zocor.  Patient has not had colon cancer screening.  States a paternal uncle had colon cancer in his 87s and his mother had a type of small intestinal cancer that was not hereditary.    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   Anxiety 08/13/2018   Routine general medical examination at a health care facility 08/13/2017   Social anxiety disorder 03/26/2013   Obesity (BMI 30-39.9) 03/26/2013   Hyperlipidemia 01/23/2007   Essential hypertension 01/23/2007   Allergic rhinitis 01/23/2007   Past Medical History:  Diagnosis Date   Allergy    Arthritis    Hyperlipidemia    Hypertension    Nephrolithiasis    Past Surgical History:  Procedure Laterality Date   HYDROCELE EXCISION     93-94   KNEE ARTHROSCOPY     left   Social History   Tobacco Use   Smoking status: Former    Current packs/day: 0.00    Types: Cigarettes    Quit date: 07/23/2005    Years since quitting: 17.8   Smokeless tobacco: Former  Substance Use Topics   Alcohol use: Yes    Alcohol/week: 2.0 standard drinks of alcohol    Types: 1 Cans of beer, 1 Shots of liquor per week    Comment: maybe on a week   Drug use: No   Family History  Problem Relation Age of Onset   Kidney disease Father     Hypertension Other    Cancer Paternal Grandmother        Lung   Allergies  Allergen Reactions   Duracillin As [Penicillin G Procaine]     GI upset   Other Rash    Allergy to wood/wood dust especially walnut.  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      ROS Negative unless stated above    Objective:     BP 132/80 (BP Location: Left Arm, Patient Position: Sitting, Cuff Size: Large)   Pulse 85   Temp 98.2 F (36.8 C) (Oral)   Ht 5\' 9"  (1.753 m)   Wt (!) 339 lb 3.2 oz (153.9 kg)   SpO2 97%   BMI 50.09 kg/m  BP Readings from Last 3 Encounters:  05/08/23 132/80  03/14/22 136/82  01/31/22 130/82   Wt Readings from Last 3 Encounters:  05/08/23 (!) 339 lb 3.2 oz (153.9 kg)  03/14/22 (!) 330 lb 6.4 oz (149.9 kg)  01/31/22 (!) 328 lb 9.6 oz (149.1 kg)      Physical Exam Constitutional:      Appearance: Normal appearance.  HENT:     Head: Normocephalic and atraumatic.     Right Ear: Tympanic membrane, ear canal and external ear normal.     Left Ear:  Tympanic membrane, ear canal and external ear normal.     Nose: Nose normal.     Mouth/Throat:     Mouth: Mucous membranes are moist.     Pharynx: No oropharyngeal exudate or posterior oropharyngeal erythema.  Eyes:     General: No scleral icterus.    Extraocular Movements: Extraocular movements intact.     Conjunctiva/sclera: Conjunctivae normal.     Pupils: Pupils are equal, round, and reactive to light.  Neck:     Thyroid: No thyromegaly.  Cardiovascular:     Rate and Rhythm: Normal rate and regular rhythm.     Pulses: Normal pulses.     Heart sounds: Normal heart sounds. No murmur heard.    No friction rub.  Pulmonary:     Effort: Pulmonary effort is normal.     Breath sounds: Normal breath sounds. No wheezing, rhonchi or rales.     Comments: Dry cough Abdominal:     General: Bowel sounds are normal.     Palpations: Abdomen is soft.     Tenderness: There is no abdominal tenderness.   Musculoskeletal:        General: No deformity. Normal range of motion.  Lymphadenopathy:     Cervical: No cervical adenopathy.  Skin:    General: Skin is warm and dry.     Findings: No lesion.  Neurological:     General: No focal deficit present.     Mental Status: He is alert and oriented to person, place, and time.  Psychiatric:        Mood and Affect: Mood normal.        Thought Content: Thought content normal.       05/08/2023   10:12 AM 03/14/2022    3:51 PM 01/31/2022    4:30 PM  Depression screen PHQ 2/9  Decreased Interest 0 0 1  Down, Depressed, Hopeless 1 0 1  PHQ - 2 Score 1 0 2  Altered sleeping 0 0 1  Tired, decreased energy 1 1 0  Change in appetite 0 1 0  Feeling bad or failure about yourself  1 0 1  Trouble concentrating 1 1 0  Moving slowly or fidgety/restless 0 0 0  Suicidal thoughts 0 0 0  PHQ-9 Score 4 3 4   Difficult doing work/chores Not difficult at all Not difficult at all Not difficult at all      05/08/2023   10:12 AM 01/29/2020    8:40 AM 08/13/2018    9:09 AM  GAD 7 : Generalized Anxiety Score  Nervous, Anxious, on Edge 1 1 2   Control/stop worrying 1 1 2   Worry too much - different things 1 1 1   Trouble relaxing 0 1 1  Restless 1 0 0  Easily annoyed or irritable 0 1 0  Afraid - awful might happen 1 1 1   Total GAD 7 Score 5 6 7   Anxiety Difficulty Not difficult at all Not difficult at all Not difficult at all     No results found for any visits on 05/08/23.    Assessment & Plan:  Encounter for well adult exam with abnormal findings -     Hemoglobin A1c  Essential hypertension -Controlled -Continue lifestyle modifications -     Doxazosin Mesylate; Take 1 tablet (8 mg total) by mouth at bedtime.  Dispense: 90 tablet; Refill: 1 -     Lisinopril; Take 1 tablet (20 mg total) by mouth daily.  Dispense: 90 tablet; Refill: 3 -  CBC with Differential/Platelet; Future -     Comprehensive metabolic panel; Future -     TSH;  Future  Anxiety -Stable -GAD-7 score 5 this visit -PHQ-9 score for this visit -Continue sertraline 50 mg daily -Discussed self-care -     Sertraline HCl; Take 1 tablet (50 mg total) by mouth daily.  Dispense: 90 tablet; Refill: 3 -     TSH; Future  Hyperlipidemia, unspecified hyperlipidemia type -Continue lifestyle modifications-     Simvastatin; Take 1 tablet (40 mg total) by mouth at bedtime.  Dispense: 90 tablet; Refill: 3 -     Comprehensive metabolic panel; Future -     Lipid panel; Future  Acute cough -Postviral worsened by allergies -Lungs CTAB -Continue supportive care with OTC cough/cold medications, warm fluids.  Offered Tessalon, declines at this time. -Given strict precautions for continued or worsening symptoms. -If continues discussed CXR.  Colon cancer screening -     Cologuard  Encounter for hepatitis C screening test for low risk patient -     Hepatitis C antibody  Age-appropriate health screenings discussed.  Will order labs.  Immunizations reviewed.  Return if symptoms worsen or fail to improve.   Deeann Saint, MD

## 2023-05-12 DIAGNOSIS — Z1211 Encounter for screening for malignant neoplasm of colon: Secondary | ICD-10-CM | POA: Diagnosis not present

## 2023-05-19 LAB — COLOGUARD: COLOGUARD: POSITIVE — AB

## 2023-05-20 ENCOUNTER — Other Ambulatory Visit: Payer: Self-pay | Admitting: Family Medicine

## 2023-05-20 DIAGNOSIS — R195 Other fecal abnormalities: Secondary | ICD-10-CM

## 2023-05-20 DIAGNOSIS — Z1211 Encounter for screening for malignant neoplasm of colon: Secondary | ICD-10-CM

## 2023-07-09 ENCOUNTER — Ambulatory Visit (AMBULATORY_SURGERY_CENTER): Payer: BC Managed Care – PPO

## 2023-07-09 VITALS — Ht 69.0 in | Wt 330.0 lb

## 2023-07-09 DIAGNOSIS — R195 Other fecal abnormalities: Secondary | ICD-10-CM

## 2023-07-09 MED ORDER — NA SULFATE-K SULFATE-MG SULF 17.5-3.13-1.6 GM/177ML PO SOLN
1.0000 | Freq: Once | ORAL | 0 refills | Status: AC
Start: 2023-07-09 — End: 2023-07-09

## 2023-08-06 ENCOUNTER — Encounter: Payer: Self-pay | Admitting: Gastroenterology

## 2023-08-09 ENCOUNTER — Other Ambulatory Visit (INDEPENDENT_AMBULATORY_CARE_PROVIDER_SITE_OTHER): Payer: BC Managed Care – PPO

## 2023-08-09 ENCOUNTER — Ambulatory Visit (AMBULATORY_SURGERY_CENTER): Payer: BC Managed Care – PPO | Admitting: Gastroenterology

## 2023-08-09 ENCOUNTER — Encounter: Payer: Self-pay | Admitting: Gastroenterology

## 2023-08-09 VITALS — BP 137/83 | HR 88 | Temp 98.8°F | Resp 21 | Ht 69.0 in | Wt 335.6 lb

## 2023-08-09 DIAGNOSIS — K640 First degree hemorrhoids: Secondary | ICD-10-CM | POA: Diagnosis not present

## 2023-08-09 DIAGNOSIS — D123 Benign neoplasm of transverse colon: Secondary | ICD-10-CM | POA: Diagnosis not present

## 2023-08-09 DIAGNOSIS — R195 Other fecal abnormalities: Secondary | ICD-10-CM

## 2023-08-09 DIAGNOSIS — K635 Polyp of colon: Secondary | ICD-10-CM | POA: Diagnosis not present

## 2023-08-09 DIAGNOSIS — D124 Benign neoplasm of descending colon: Secondary | ICD-10-CM

## 2023-08-09 DIAGNOSIS — Z1211 Encounter for screening for malignant neoplasm of colon: Secondary | ICD-10-CM

## 2023-08-09 DIAGNOSIS — D125 Benign neoplasm of sigmoid colon: Secondary | ICD-10-CM

## 2023-08-09 LAB — COMPREHENSIVE METABOLIC PANEL
ALT: 27 U/L (ref 0–53)
AST: 22 U/L (ref 0–37)
Albumin: 4.5 g/dL (ref 3.5–5.2)
Alkaline Phosphatase: 48 U/L (ref 39–117)
BUN: 10 mg/dL (ref 6–23)
CO2: 27 meq/L (ref 19–32)
Calcium: 9 mg/dL (ref 8.4–10.5)
Chloride: 103 meq/L (ref 96–112)
Creatinine, Ser: 0.83 mg/dL (ref 0.40–1.50)
GFR: 104.37 mL/min (ref >60.00)
Glucose, Bld: 93 mg/dL (ref 70–99)
Potassium: 4.1 meq/L (ref 3.5–5.1)
Sodium: 140 meq/L (ref 135–145)
Total Bilirubin: 0.7 mg/dL (ref 0.2–1.2)
Total Protein: 7.3 g/dL (ref 6.0–8.3)

## 2023-08-09 LAB — CBC WITH DIFFERENTIAL/PLATELET
Basophils Absolute: 0.1 10*3/uL (ref 0.0–0.1)
Basophils Relative: 0.9 % (ref 0.0–3.0)
Eosinophils Absolute: 0.1 10*3/uL (ref 0.0–0.7)
Eosinophils Relative: 1.7 % (ref 0.0–5.0)
HCT: 44.3 % (ref 39.0–52.0)
Hemoglobin: 15 g/dL (ref 13.0–17.0)
Lymphocytes Relative: 31 % (ref 12.0–46.0)
Lymphs Abs: 2.2 10*3/uL (ref 0.7–4.0)
MCHC: 33.7 g/dL (ref 30.0–36.0)
MCV: 89.3 fL (ref 78.0–100.0)
Monocytes Absolute: 0.5 10*3/uL (ref 0.1–1.0)
Monocytes Relative: 6.5 % (ref 3.0–12.0)
Neutro Abs: 4.3 10*3/uL (ref 1.4–7.7)
Neutrophils Relative %: 59.9 % (ref 43.0–77.0)
Platelets: 327 10*3/uL (ref 150.0–400.0)
RBC: 4.97 Mil/uL (ref 4.22–5.81)
RDW: 13.2 % (ref 11.5–15.5)
WBC: 7.3 10*3/uL (ref 4.0–10.5)

## 2023-08-09 MED ORDER — SODIUM CHLORIDE 0.9 % IV SOLN: 500.0000 mL | Freq: Once | INTRAVENOUS | Status: DC

## 2023-08-09 NOTE — Progress Notes (Signed)
Sedate, gd SR, tolerated procedure well, VSS, report to RN 

## 2023-08-09 NOTE — Progress Notes (Signed)
Pt's states no medical or surgical changes since previsit or office visit. 

## 2023-08-09 NOTE — Progress Notes (Signed)
[facility] History and Physical   Primary Care Physician:  Deeann Saint, MD   Reason for Procedure:    positive Cologuard  Plan:     colonoscopy     HPI: Jim Goodwin is a 48 y.o. male    Past Medical History:  Diagnosis Date   Allergy    Arthritis    Hyperlipidemia    Hypertension    Nephrolithiasis     Past Surgical History:  Procedure Laterality Date   HYDROCELE EXCISION     93-94   KNEE ARTHROSCOPY     left    Prior to Admission medications   Medication Sig Start Date End Date Taking? Authorizing Provider  aspirin 81 MG EC tablet Take 81 mg by mouth daily.     Yes [provider]  cetirizine (ZYRTEC) 10 MG tablet Take 10 mg by mouth daily.     Yes [provider]  doxazosin (CARDURA) 8 MG tablet Take 1 tablet (8 mg total) by mouth at bedtime. 05/08/23  Yes Deeann Saint, MD  fluticasone (FLONASE) 50 MCG/ACT nasal spray Place 1 spray into both nostrils daily. As needed for additional allergy relief. 08/13/17  Yes Roderick Pee, MD  lisinopril (ZESTRIL) 20 MG tablet Take 1 tablet (20 mg total) by mouth daily. 05/08/23  Yes Deeann Saint, MD  sertraline (ZOLOFT) 50 MG tablet Take 1 tablet (50 mg total) by mouth daily. 05/08/23  Yes Deeann Saint, MD  simvastatin (ZOCOR) 40 MG tablet Take 1 tablet (40 mg total) by mouth at bedtime. 05/08/23  Yes Deeann Saint, MD  EPINEPHrine 0.3 mg/0.3 mL IJ SOAJ injection Inject 0.3 mg into the muscle as needed for anaphylaxis. 04/03/21   Deeann Saint, MD  LORazepam (ATIVAN) 1 MG tablet One half to one tablet one hour prior to flying 01/31/22   Deeann Saint, MD    Current Outpatient Medications  Medication Sig Dispense Refill   aspirin 81 MG EC tablet Take 81 mg by mouth daily.       cetirizine (ZYRTEC) 10 MG tablet Take 10 mg by mouth daily.       doxazosin (CARDURA) 8 MG tablet Take 1 tablet (8 mg total) by mouth at bedtime. 90 tablet 1   fluticasone (FLONASE) 50 MCG/ACT  nasal spray Place 1 spray into both nostrils daily. As needed for additional allergy relief. 32 g 6   lisinopril (ZESTRIL) 20 MG tablet Take 1 tablet (20 mg total) by mouth daily. 90 tablet 3   sertraline (ZOLOFT) 50 MG tablet Take 1 tablet (50 mg total) by mouth daily. 90 tablet 3   simvastatin (ZOCOR) 40 MG tablet Take 1 tablet (40 mg total) by mouth at bedtime. 90 tablet 3   EPINEPHrine 0.3 mg/0.3 mL IJ SOAJ injection Inject 0.3 mg into the muscle as needed for anaphylaxis. 1 each 1   LORazepam (ATIVAN) 1 MG tablet One half to one tablet one hour prior to flying 30 tablet 1   Current Facility-Administered Medications  Medication Dose Route Frequency Provider Last Rate Last Admin   0.9 %  sodium chloride infusion  500 mL Intravenous Once Lynann Bologna, MD        Allergies as of 08/09/2023 - Review Complete 08/09/2023  Allergen Reaction Noted   Duracillin as [penicillin g procaine]  08/28/2010   Other Rash 04/03/2021    Family History  Problem Relation Age of Onset   Kidney disease Father    Colon cancer  Paternal Uncle    Cancer Paternal Grandmother        Lung   Hypertension Other    Colon polyps Neg Hx    Esophageal cancer Neg Hx    Rectal cancer Neg Hx    Stomach cancer Neg Hx     Social History   Socioeconomic History   Marital status: Married    Spouse name: Not on file   Number of children: Not on file   Years of education: Not on file   Highest education level: Bachelor's degree (e.g., BA, AB, BS)  Occupational History   Not on file  Tobacco Use   Smoking status: Former    Current packs/day: 0.00    Types: Cigarettes    Quit date: 07/23/2005    Years since quitting: 18.0   Smokeless tobacco: Former  Substance and Sexual Activity   Alcohol use: Yes    Alcohol/week: 2.0 standard drinks of alcohol    Types: 1 Cans of beer, 1 Shots of liquor per week    Comment: maybe on a week   Drug use: No   Sexual activity: Yes    Partners: Female, Male  Other Topics  Concern   Not on file  Social History Narrative   Not on file   Social Drivers of Health   Financial Resource Strain: Low Risk  (05/06/2023)   Overall Financial Resource Strain (CARDIA)    Difficulty of Paying Living Expenses: Not hard at all  Food Insecurity: No Food Insecurity (05/06/2023)   Hunger Vital Sign    Worried About Running Out of Food in the Last Year: Never true    Ran Out of Food in the Last Year: Never true  Transportation Needs: No Transportation Needs (05/06/2023)   PRAPARE - Administrator, Civil Service (Medical): No    Lack of Transportation (Non-Medical): No  Physical Activity: Insufficiently Active (05/06/2023)   Exercise Vital Sign    Days of Exercise per Week: 3 days    Minutes of Exercise per Session: 30 min  Stress: No Stress Concern Present (05/06/2023)   [employer] of Occupational Health - Occupational Stress Questionnaire    Feeling of Stress : Only a little  Social Connections: Moderately Integrated (05/06/2023)   Social Connection and Isolation Panel [NHANES]    Frequency of Communication with Friends and Family: Twice a week    Frequency of Social Gatherings with Friends and Family: Once a week    Attends Religious Services: 1 to 4 times per year    Active Member of [employer] or Organizations: No    Attends Engineer, structural: Not on file    Marital Status: Married  Catering manager Violence: Not on file    Review of Systems: Positive for none All other review of systems negative except as mentioned in the HPI.  Physical Exam: Vital signs in last 24 hours: @VSRANGES @   General:   Alert,  Well-developed, well-nourished, pleasant and cooperative in NAD Lungs:  Clear throughout to auscultation.   Heart:  Regular rate and rhythm; no murmurs, clicks, rubs,  or gallops. Abdomen:  Soft, nontender and nondistended. Normal bowel sounds.   Neuro/Psych:  Alert and cooperative. Normal mood and affect. A and O x 3    No  significant changes were identified.  The patient continues to be an appropriate candidate for the planned procedure and anesthesia.   Edman Circle, MD. [facility] 08/09/2023 10:42 AM@

## 2023-08-09 NOTE — Op Note (Signed)
[facility] Patient Name: Jim Goodwin Procedure Date: 08/09/2023 10:49 AM MRN: [id number] Endoscopist: Lynann Bologna , MD, [id number] Age: 48 Referring MD:  Date of Birth: [birth year] Gender: Male Account #: [id number] Procedure:                Colonoscopy Indications:              Positive Cologuard test Medicines:                Monitored Anesthesia Care Procedure:                Pre-Anesthesia Assessment:                           - Prior to the procedure, a History and Physical                            was performed, and patient medications and                            allergies were reviewed. The patient's tolerance of                            previous anesthesia was also reviewed. The risks                            and benefits of the procedure and the sedation                            options and risks were discussed with the patient.                            All questions were answered, and informed consent                            was obtained. Prior Anticoagulants: The patient has                            taken no anticoagulant or antiplatelet agents. ASA                            Grade Assessment: II - A patient with mild systemic                            disease. After reviewing the risks and benefits,                            the patient was deemed in satisfactory condition to                            undergo the procedure.                           After obtaining informed consent, the colonoscope  was passed under direct vision. Throughout the                            procedure, the patient's blood pressure, pulse, and                            oxygen saturations were monitored continuously. The                            Olympus Scope SN [id number] was introduced through the                            anus and advanced to the the cecum, identified by                            appendiceal orifice and ileocecal  valve. The                            colonoscopy was performed without difficulty. The                            patient tolerated the procedure well. The quality                            of the bowel preparation was good. The ileocecal                            valve, appendiceal orifice, and rectum were                            photographed. Scope In: 10:53:26 AM Scope Out: 11:22:11 AM Scope Withdrawal Time: 0 hours 25 minutes 12 seconds  Total Procedure Duration: 0 hours 28 minutes 45 seconds  Findings:                 A frond-like/villous, polypoid, sessile and                            ulcerated non-obstructing medium-sized mass was                            found at the hepatic flexure. The mass was                            partially circumferential (involving one-half of                            the lumen circumference). The mass measured 5 cm x                            5 cm involving 2 folds with a central ulceration.                            No bleeding was present. This was biopsied with a  cold forceps for histology. Area, just distal to                            the mass, was tattooed with an injection of 3 mL of                            Spot (carbon black).                           A 6 mm polyp was found in the mid transverse colon.                            The polyp was sessile. The polyp was removed with a                            cold snare. Resection and retrieval were complete.                           A 6 mm polyp was found in the mid descending colon.                            The polyp was sessile. The polyp was removed with a                            cold snare. Resection and retrieval were complete.                           Ten semi-pedunculated polyps were found in the mid                            sigmoid colon and distal sigmoid colon. The polyps                            were 8 to 10 mm in size. These  polyps were removed                            with a hot snare. Resection and retrieval were                            complete.                           Non-bleeding internal hemorrhoids were found during                            retroflexion. The hemorrhoids were small and Grade                            I (internal hemorrhoids that do not prolapse).                           The exam was otherwise without abnormality on  direct and retroflexion views. Complications:            No immediate complications. Estimated Blood Loss:     Estimated blood loss: none. Impression:               - Hepatic flexure mass. Biopsied. Tattooed.                           - One 6 mm polyp in the mid transverse colon,                            removed with a cold snare. Resected and retrieved.                           - One 6 mm polyp in the mid descending colon,                            removed with a cold snare. Resected and retrieved.                           - Ten 8 to 10 mm polyps in the mid sigmoid colon                            and in the distal sigmoid colon, removed with a hot                            snare. Resected and retrieved.                           - Non-bleeding internal hemorrhoids.                           - The examination was otherwise normal on direct                            and retroflexion views. Recommendation:           - Patient has a contact number available for                            emergencies. The signs and symptoms of potential                            delayed complications were discussed with the                            patient. Return to normal activities tomorrow.                            Written discharge instructions were provided to the                            patient.                           -  Resume previous diet.                           - Continue present medications.                           - Await  pathology results.                           - No aspirin, ibuprofen, naproxen, or other                            non-steroidal anti-inflammatory drugs for 5 days                            after polyp removal.                           - CBC, CMP, CEA                           - Repeat colonoscopy for surveillance based on                            clinical status at that time.                           - The findings and recommendations were discussed                            with the patient's family. Lynann Bologna, MD 08/09/2023 11:32:58 AM This report has been signed electronically.

## 2023-08-09 NOTE — Patient Instructions (Addendum)
-   Resume previous diet. - Continue present medications. - Await pathology results. - No aspirin, ibuprofen, naproxen, or other non-steroidal anti-inflammatory drugs for 5 days after polyp removal. - CBC, CMP, CEA - Repeat colonoscopy for surveillance based on clinical status at that time.  YOU HAD AN ENDOSCOPIC PROCEDURE TODAY AT THE [facility]:   Refer to the procedure report that was given to you for any specific questions about what was found during the examination.  If the procedure report does not answer your questions, please call your gastroenterologist to clarify.  If you requested that your care partner not be given the details of your procedure findings, then the procedure report has been included in a sealed envelope for you to review at your convenience later.  YOU SHOULD EXPECT: Some feelings of bloating in the abdomen. Passage of more gas than usual.  Walking can help get rid of the air that was put into your GI tract during the procedure and reduce the bloating. If you had a lower endoscopy (such as a colonoscopy or flexible sigmoidoscopy) you may notice spotting of blood in your stool or on the toilet paper. If you underwent a bowel prep for your procedure, you may not have a normal bowel movement for a few days.  Please Note:  You might notice some irritation and congestion in your nose or some drainage.  This is from the oxygen used during your procedure.  There is no need for concern and it should clear up in a day or so.  SYMPTOMS TO REPORT IMMEDIATELY:  Following lower endoscopy (colonoscopy or flexible sigmoidoscopy):  Excessive amounts of blood in the stool  Significant tenderness or worsening of abdominal pains  Swelling of the abdomen that is new, acute  Fever of 100F or higher  For urgent or emergent issues, a gastroenterologist can be reached at any hour by calling [phone]. Do not use MyChart messaging for urgent concerns.    DIET:  We do  recommend a small meal at first, but then you may proceed to your regular diet.  Drink plenty of fluids but you should avoid alcoholic beverages for 24 hours.  ACTIVITY:  You should plan to take it easy for the rest of today and you should NOT DRIVE or use heavy machinery until tomorrow (because of the sedation medicines used during the test).    FOLLOW UP: Our staff will call the number listed on your records the next business day following your procedure.  We will call around 7:15- 8:00 am to check on you and address any questions or concerns that you may have regarding the information given to you following your procedure. If we do not reach you, we will leave a message.     If any biopsies were taken you will be contacted by phone or by letter within the next 1-3 weeks.  Please call us at [phone] if you have not heard about the biopsies in 3 weeks.    SIGNATURES/CONFIDENTIALITY: You and/or your care partner have signed paperwork which will be entered into your electronic medical record.  These signatures attest to the fact that that the information above on your After Visit Summary has been reviewed and is understood.  Full responsibility of the confidentiality of this discharge information lies with you and/or your care-partner.

## 2023-08-09 NOTE — Progress Notes (Signed)
Called to room to assist during endoscopic procedure.  Patient ID and intended procedure confirmed with present staff. Received instructions for my participation in the procedure from the performing physician.  

## 2023-08-10 LAB — CEA: CEA: <2 ng/mL

## 2023-08-12 ENCOUNTER — Telehealth: Payer: Self-pay

## 2023-08-12 NOTE — Telephone Encounter (Signed)
  Follow up Call-     08/09/2023   10:19 AM  Call back number  Post procedure Call Back phone  # [phone]  Permission to leave phone message Yes     Patient questions:  Do you have a fever, pain , or abdominal swelling? Yes.   Pain Score  0 *  Have you tolerated food without any problems? Yes.    Have you been able to return to your normal activities? Yes.    Do you have any questions about your discharge instructions: Diet   No. Medications  No. Follow up visit  No.  Do you have questions or concerns about your Care? No.  Actions: * If pain score is 4 or above: No action needed, pain <4.

## 2023-08-14 LAB — SURGICAL PATHOLOGY

## 2023-08-14 NOTE — Progress Notes (Signed)
Please inform the patient. Discussed biopsy results with the patient in detail. Hepatic flexure mass Bx: Tubulovillous adenoma.  Due to the size/location, this is not amenable to endoscopic resection.  Besides, the could be underlying early carcinoma and the large lesion.  I have recommended him to see surgery regarding that.  He had normal CBC, CMP and CEA level.   Viviann Spare, please make appointment with [place] [facility]-colorectal surgery Recall colonoscopy 08/2024 Maralyn Sago) Send report to family physician

## 2023-09-25 ENCOUNTER — Ambulatory Visit: Payer: Self-pay | Admitting: Surgery

## 2023-09-25 DIAGNOSIS — Z800 Family history of malignant neoplasm of digestive organs: Secondary | ICD-10-CM | POA: Diagnosis not present

## 2023-09-25 DIAGNOSIS — K6389 Other specified diseases of intestine: Secondary | ICD-10-CM | POA: Diagnosis not present

## 2023-09-25 DIAGNOSIS — Z860101 Personal history of adenomatous and serrated colon polyps: Secondary | ICD-10-CM | POA: Diagnosis not present

## 2023-09-25 DIAGNOSIS — R739 Hyperglycemia, unspecified: Secondary | ICD-10-CM

## 2023-09-25 DIAGNOSIS — Z6841 Body Mass Index (BMI) 40.0 and over, adult: Secondary | ICD-10-CM | POA: Diagnosis not present

## 2023-10-17 NOTE — Patient Instructions (Addendum)
 SURGICAL WAITING ROOM VISITATION  Patients having surgery or a procedure may have no more than 2 support people in the waiting area - these visitors may rotate.    Children under the age of 61 must have an adult with them who is not the patient.  Due to an increase in RSV and influenza rates and associated hospitalizations, children ages 69 and under may not visit patients in [facility] hospitals.  Visitors with respiratory illnesses are discouraged from visiting and should remain at home.  If the patient needs to stay at the hospital during part of their recovery, the visitor guidelines for inpatient rooms apply. Pre-op nurse will coordinate an appropriate time for 1 support person to accompany patient in pre-op.  This support person may not rotate.    Please refer to the [facility] website for the visitor guidelines for Inpatients (after your surgery is over and you are in a regular room).    Your procedure is scheduled on: 10/30/23   Report to [facility] Main Entrance    Report to admitting at 9:15 AM   Call this number if you have problems the morning of surgery [phone]   Follow a clear liquid diet the day before surgery.    You may have the following liquids until 8:30 AM DAY OF SURGERY  Water Non-Citrus Juices (without pulp, NO RED-Apple, White grape, White cranberry) Black Coffee (NO MILK/CREAM OR CREAMERS, sugar ok)  Clear Tea (NO MILK/CREAM OR CREAMERS, sugar ok) regular and decaf                             Plain Jell-O (NO RED)                                           Fruit ices (not with fruit pulp, NO RED)                                     Popsicles (NO RED)                                                               Sports drinks like Gatorade (NO RED)              Drink 2 Ensure drinks AT 10:00 PM the night before surgery.        The day of surgery:  Drink ONE (1) Pre-Surgery Clear Ensure at 8:30 AM the morning of surgery. Drink in one  sitting. Do not sip.  This drink was given to you during your hospital  pre-op appointment visit. Nothing else to drink after completing the  Pre-Surgery Clear Ensure.          If you have questions, please contact your surgeon's office.   FOLLOW BOWEL PREP AND ANY ADDITIONAL PRE OP INSTRUCTIONS YOU RECEIVED FROM YOUR SURGEON'S OFFICE!!!     Oral Hygiene is also important to reduce your risk of infection.  Remember - BRUSH YOUR TEETH THE MORNING OF SURGERY WITH YOUR REGULAR TOOTHPASTE  DENTURES WILL BE REMOVED PRIOR TO SURGERY PLEASE DO NOT APPLY "Poly grip" OR ADHESIVES!!!   Stop all vitamins and herbal supplements 7 days before surgery.   Take these medicines the morning of surgery with A SIP OF WATER: None                              You may not have any metal on your body including jewelry, and body piercing             Do not wear lotions, powders, cologne, or deodorant              Men may shave face and neck.   Do not bring valuables to the hospital. [facility] IS NOT             RESPONSIBLE   FOR VALUABLES.   Contacts, glasses, dentures or bridgework may not be worn into surgery.   Bring small overnight bag day of surgery.   DO NOT BRING YOUR HOME MEDICATIONS TO THE HOSPITAL. PHARMACY WILL DISPENSE MEDICATIONS LISTED ON YOUR MEDICATION LIST TO YOU DURING YOUR ADMISSION IN THE HOSPITAL!              Please read over the following fact sheets you were given: IF YOU HAVE QUESTIONS ABOUT YOUR PRE-OP INSTRUCTIONS PLEASE CALL [phone] Contras    If you received a COVID test during your pre-op visit  it is requested that you wear a mask when out in public, stay away from anyone that may not be feeling well and notify your surgeon if you develop symptoms. If you test positive for Covid or have been in contact with anyone that has tested positive in the last 10 days please notify you surgeon.    [place] - Preparing for  Surgery Before surgery, you can play an important role.  Because skin is not sterile, your skin needs to be as free of germs as possible.  You can reduce the number of germs on your skin by washing with CHG (chlorahexidine gluconate) soap before surgery.  CHG is an antiseptic cleaner which kills germs and bonds with the skin to continue killing germs even after washing. Please DO NOT use if you have an allergy to CHG or antibacterial soaps.  If your skin becomes reddened/irritated stop using the CHG and inform your nurse when you arrive at Short Stay. Do not shave (including legs and underarms) for at least 48 hours prior to the first CHG shower.  You may shave your face/neck.  Please follow these instructions carefully:  1.  Shower with CHG Soap the night before surgery and the  morning of surgery.  2.  If you choose to wash your hair, wash your hair first as usual with your normal  shampoo.  3.  After you shampoo, rinse your hair and body thoroughly to remove the shampoo.                             4.  Use CHG as you would any other liquid soap.  You can apply chg directly to the skin and wash.  Gently with a scrungie or clean washcloth.  5.  Apply the CHG Soap to your body ONLY FROM THE NECK DOWN.   Do   not use on face/ open  Wound or open sores. Avoid contact with eyes, ears mouth and   genitals (private parts).                       Wash face,  Genitals (private parts) with your normal soap.             6.  Wash thoroughly, paying special attention to the area where your    surgery  will be performed.  7.  Thoroughly rinse your body with warm water from the neck down.  8.  DO NOT shower/wash with your normal soap after using and rinsing off the CHG Soap.                9.  Pat yourself dry with a clean towel.            10.  Wear clean pajamas.            11.  Place clean sheets on your bed the night of your first shower and do not  sleep with pets. Day of Surgery  : Do not apply any lotions/deodorants the morning of surgery.  Please wear clean clothes to the hospital/surgery center.  FAILURE TO FOLLOW THESE INSTRUCTIONS MAY RESULT IN THE CANCELLATION OF YOUR SURGERY  PATIENT SIGNATURE_________________________________  NURSE SIGNATURE__________________________________  ________________________________________________________________________  Rogelia Mire  An incentive spirometer is a tool that can help keep your lungs clear and active. This tool measures how well you are filling your lungs with each breath. Taking long deep breaths may help reverse or decrease the chance of developing breathing (pulmonary) problems (especially infection) following: A long period of time when you are unable to move or be active. BEFORE THE PROCEDURE  If the spirometer includes an indicator to show your best effort, your nurse or respiratory therapist will set it to a desired goal. If possible, sit up straight or lean slightly forward. Try not to slouch. Hold the incentive spirometer in an upright position. INSTRUCTIONS FOR USE  Sit on the edge of your bed if possible, or sit up as far as you can in bed or on a chair. Hold the incentive spirometer in an upright position. Breathe out normally. Place the mouthpiece in your mouth and seal your lips tightly around it. Breathe in slowly and as deeply as possible, raising the piston or the ball toward the top of the column. Hold your breath for 3-5 seconds or for as long as possible. Allow the piston or ball to fall to the bottom of the column. Remove the mouthpiece from your mouth and breathe out normally. Rest for a few seconds and repeat Steps 1 through 7 at least 10 times every 1-2 hours when you are awake. Take your time and take a few normal breaths between deep breaths. The spirometer may include an indicator to show your best effort. Use the indicator as a goal to work toward during each repetition. After  each set of 10 deep breaths, practice coughing to be sure your lungs are clear. If you have an incision (the cut made at the time of surgery), support your incision when coughing by placing a pillow or rolled up towels firmly against it. Once you are able to get out of bed, walk around indoors and cough well. You may stop using the incentive spirometer when instructed by your caregiver.  RISKS AND COMPLICATIONS Take your time so you do not get dizzy or light-headed. If you are in pain, you may  need to take or ask for pain medication before doing incentive spirometry. It is harder to take a deep breath if you are having pain. AFTER USE Rest and breathe slowly and easily. It can be helpful to keep track of a log of your progress. Your caregiver can provide you with a simple table to help with this. If you are using the spirometer at home, follow these instructions: SEEK MEDICAL CARE IF:  You are having difficultly using the spirometer. You have trouble using the spirometer as often as instructed. Your pain medication is not giving enough relief while using the spirometer. You develop fever of 100.5 F (38.1 C) or higher. SEEK IMMEDIATE MEDICAL CARE IF:  You cough up bloody sputum that had not been present before. You develop fever of 102 F (38.9 C) or greater. You develop worsening pain at or near the incision site. MAKE SURE YOU:  Understand these instructions. Will watch your condition. Will get help right away if you are not doing well or get worse. Document Released: 11/19/2006 Document Revised: 10/01/2011 Document Reviewed: 01/20/2007 [employer] Patient Information 2014 [employer], [place].   ________________________________________________________________________ WHAT IS A BLOOD TRANSFUSION? Blood Transfusion Information  A transfusion is the replacement of blood or some of its parts. Blood is made up of multiple cells which provide different functions. Red blood cells carry oxygen and  are used for blood loss replacement. White blood cells fight against infection. Platelets control bleeding. Plasma helps clot blood. Other blood products are available for specialized needs, such as hemophilia or other clotting disorders. BEFORE THE TRANSFUSION  Who gives blood for transfusions?  Healthy volunteers who are fully evaluated to make sure their blood is safe. This is blood bank blood. Transfusion therapy is the safest it has ever been in the practice of medicine. Before blood is taken from a donor, a complete history is taken to make sure that person has no history of diseases nor engages in risky social behavior (examples are intravenous drug use or sexual activity with multiple partners). The donor's travel history is screened to minimize risk of transmitting infections, such as malaria. The donated blood is tested for signs of infectious diseases, such as HIV and hepatitis. The blood is then tested to be sure it is compatible with you in order to minimize the chance of a transfusion reaction. If you or a relative donates blood, this is often done in anticipation of surgery and is not appropriate for emergency situations. It takes many days to process the donated blood. RISKS AND COMPLICATIONS Although transfusion therapy is very safe and saves many lives, the main dangers of transfusion include:  Getting an infectious disease. Developing a transfusion reaction. This is an allergic reaction to something in the blood you were given. Every precaution is taken to prevent this. The decision to have a blood transfusion has been considered carefully by your caregiver before blood is given. Blood is not given unless the benefits outweigh the risks. AFTER THE TRANSFUSION Right after receiving a blood transfusion, you will usually feel much better and more energetic. This is especially true if your red blood cells have gotten low (anemic). The transfusion raises the level of the red blood cells  which carry oxygen, and this usually causes an energy increase. The nurse administering the transfusion will monitor you carefully for complications. HOME CARE INSTRUCTIONS  No special instructions are needed after a transfusion. You may find your energy is better. Speak with your caregiver about any limitations on activity for  underlying diseases you may have. SEEK MEDICAL CARE IF:  Your condition is not improving after your transfusion. You develop redness or irritation at the intravenous (IV) site. SEEK IMMEDIATE MEDICAL CARE IF:  Any of the following symptoms occur over the next 12 hours: Shaking chills. You have a temperature by mouth above 102 F (38.9 C), not controlled by medicine. Chest, back, or muscle pain. People around you feel you are not acting correctly or are confused. Shortness of breath or difficulty breathing. Dizziness and fainting. You get a rash or develop hives. You have a decrease in urine output. Your urine turns a dark color or changes to pink, red, or brown. Any of the following symptoms occur over the next 10 days: You have a temperature by mouth above 102 F (38.9 C), not controlled by medicine. Shortness of breath. Weakness after normal activity. The white part of the eye turns yellow (jaundice). You have a decrease in the amount of urine or are urinating less often. Your urine turns a dark color or changes to pink, red, or brown. Document Released: 07/06/2000 Document Revised: 10/01/2011 Document Reviewed: 02/23/2008 [facility] Patient Information 2014 [place], [place].  _______________________________________________________________________

## 2023-10-17 NOTE — Progress Notes (Addendum)
 COVID Vaccine Completed:yes  Date of COVID positive in last 90 days: no  PCP - Abbe Amsterdam, MD Cardiologist - n/a  Chest x-ray - n/a EKG - 10/18/23 Epic/chart Stress Test - n/a ECHO - n/a Cardiac Cath - n/a Pacemaker/ICD device last checked: n/a Spinal Cord Stimulator: n/a  Bowel Prep - clears day before, miralax, antibiotics. Patient has instructions and picked up supplies  Sleep Study - n/a CPAP -   Fasting Blood Sugar - n/a Checks Blood Sugar _____ times a day  Last dose of GLP1 agonist-  N/A GLP1 instructions:  Hold 7 days before surgery    Last dose of SGLT-2 inhibitors-  N/A SGLT-2 instructions:  Hold 3 days before surgery    Blood Thinner Instructions:  Last dose:   Time: Aspirin Instructions: ASA 81, no instructions per pt. Instructed to ask PCP Last Dose:  Activity level: Can go up a flight of stairs and perform activities of daily living without stopping and without symptoms of chest pain or shortness of breath.  Anesthesia review: PVCs on EKG  Patient denies shortness of breath, fever, cough and chest pain at PAT appointment  Patient verbalized understanding of instructions that were given to them at the PAT appointment. Patient was also instructed that they will need to review over the PAT instructions again at home before surgery.

## 2023-10-18 ENCOUNTER — Encounter (HOSPITAL_COMMUNITY): Payer: Self-pay

## 2023-10-18 ENCOUNTER — Encounter (HOSPITAL_COMMUNITY)
Admission: RE | Admit: 2023-10-18 | Discharge: 2023-10-18 | Disposition: A | Discharge status: Home / Self Care | Source: Ambulatory Visit | Attending: Surgery | Admitting: Surgery

## 2023-10-18 ENCOUNTER — Other Ambulatory Visit: Payer: Self-pay

## 2023-10-18 VITALS — BP 133/88 | HR 96 | Temp 98.6°F | Resp 16 | Ht 69.0 in | Wt 340.0 lb

## 2023-10-18 DIAGNOSIS — Z01818 Encounter for other preprocedural examination: Secondary | ICD-10-CM | POA: Insufficient documentation

## 2023-10-18 DIAGNOSIS — Z01812 Encounter for preprocedural laboratory examination: Secondary | ICD-10-CM | POA: Diagnosis not present

## 2023-10-18 DIAGNOSIS — Z01810 Encounter for preprocedural cardiovascular examination: Secondary | ICD-10-CM | POA: Diagnosis not present

## 2023-10-18 DIAGNOSIS — I10 Essential (primary) hypertension: Secondary | ICD-10-CM | POA: Insufficient documentation

## 2023-10-18 HISTORY — DX: Anxiety disorder, unspecified: F41.9

## 2023-10-18 HISTORY — DX: Other complications of anesthesia, initial encounter: T88.59XA

## 2023-10-18 LAB — CBC
HCT: 45 % (ref 39.0–52.0)
Hemoglobin: 14.7 g/dL (ref 13.0–17.0)
MCH: 29.9 pg (ref 26.0–34.0)
MCHC: 32.7 g/dL (ref 30.0–36.0)
MCV: 91.6 fL (ref 80.0–100.0)
Platelets: 267 10*3/uL (ref 150–400)
RBC: 4.91 MIL/uL (ref 4.22–5.81)
RDW: 12.4 % (ref 11.5–15.5)
WBC: 5.9 10*3/uL (ref 4.0–10.5)
nRBC: 0 % (ref 0.0–0.2)

## 2023-10-18 LAB — BASIC METABOLIC PANEL WITH GFR
Anion gap: 7 (ref 5–15)
BUN: 13 mg/dL (ref 6–20)
CO2: 24 mmol/L (ref 22–32)
Calcium: 8.8 mg/dL — ABNORMAL LOW (ref 8.9–10.3)
Chloride: 106 mmol/L (ref 98–111)
Creatinine, Ser: 0.84 mg/dL (ref 0.61–1.24)
GFR, Estimated: >60 mL/min (ref >60)
Glucose, Bld: 92 mg/dL (ref 70–99)
Potassium: 3.9 mmol/L (ref 3.5–5.1)
Sodium: 137 mmol/L (ref 135–145)

## 2023-10-18 LAB — TYPE AND SCREEN
ABO/RH(D): A POS
Antibody Screen: NEGATIVE

## 2023-10-30 ENCOUNTER — Inpatient Hospital Stay (HOSPITAL_COMMUNITY): Payer: Self-pay | Admitting: Physician Assistant

## 2023-10-30 ENCOUNTER — Inpatient Hospital Stay (HOSPITAL_COMMUNITY)
Admission: RE | Admit: 2023-10-30 | Discharge: 2023-11-01 | DRG: 330 | Disposition: A | Discharge status: Home / Self Care | Attending: Surgery | Admitting: Surgery

## 2023-10-30 ENCOUNTER — Inpatient Hospital Stay (HOSPITAL_COMMUNITY): Admitting: Physician Assistant

## 2023-10-30 ENCOUNTER — Encounter (HOSPITAL_COMMUNITY): Payer: Self-pay | Admitting: Surgery

## 2023-10-30 ENCOUNTER — Encounter (HOSPITAL_COMMUNITY)
Admission: RE | Disposition: A | Discharge status: Home / Self Care | Payer: Self-pay | Source: Home / Self Care | Attending: Surgery

## 2023-10-30 ENCOUNTER — Other Ambulatory Visit: Payer: Self-pay

## 2023-10-30 DIAGNOSIS — Z841 Family history of disorders of kidney and ureter: Secondary | ICD-10-CM

## 2023-10-30 DIAGNOSIS — E785 Hyperlipidemia, unspecified: Secondary | ICD-10-CM | POA: Diagnosis present

## 2023-10-30 DIAGNOSIS — K6389 Other specified diseases of intestine: Principal | ICD-10-CM | POA: Diagnosis present

## 2023-10-30 DIAGNOSIS — Z8701 Personal history of pneumonia (recurrent): Secondary | ICD-10-CM | POA: Diagnosis not present

## 2023-10-30 DIAGNOSIS — Z880 Allergy status to penicillin: Secondary | ICD-10-CM | POA: Diagnosis not present

## 2023-10-30 DIAGNOSIS — K635 Polyp of colon: Secondary | ICD-10-CM | POA: Diagnosis not present

## 2023-10-30 DIAGNOSIS — Z801 Family history of malignant neoplasm of trachea, bronchus and lung: Secondary | ICD-10-CM | POA: Diagnosis not present

## 2023-10-30 DIAGNOSIS — Z6841 Body Mass Index (BMI) 40.0 and over, adult: Secondary | ICD-10-CM | POA: Diagnosis not present

## 2023-10-30 DIAGNOSIS — Z87891 Personal history of nicotine dependence: Secondary | ICD-10-CM

## 2023-10-30 DIAGNOSIS — F419 Anxiety disorder, unspecified: Secondary | ICD-10-CM | POA: Diagnosis not present

## 2023-10-30 DIAGNOSIS — K420 Umbilical hernia with obstruction, without gangrene: Secondary | ICD-10-CM | POA: Diagnosis present

## 2023-10-30 DIAGNOSIS — Z7982 Long term (current) use of aspirin: Secondary | ICD-10-CM

## 2023-10-30 DIAGNOSIS — Z79899 Other long term (current) drug therapy: Secondary | ICD-10-CM

## 2023-10-30 DIAGNOSIS — Z9889 Other specified postprocedural states: Secondary | ICD-10-CM | POA: Diagnosis not present

## 2023-10-30 DIAGNOSIS — D122 Benign neoplasm of ascending colon: Secondary | ICD-10-CM | POA: Diagnosis not present

## 2023-10-30 DIAGNOSIS — Z91048 Other nonmedicinal substance allergy status: Secondary | ICD-10-CM

## 2023-10-30 DIAGNOSIS — M1990 Unspecified osteoarthritis, unspecified site: Secondary | ICD-10-CM | POA: Diagnosis present

## 2023-10-30 DIAGNOSIS — Z800 Family history of malignant neoplasm of digestive organs: Secondary | ICD-10-CM

## 2023-10-30 DIAGNOSIS — I10 Essential (primary) hypertension: Secondary | ICD-10-CM | POA: Diagnosis present

## 2023-10-30 DIAGNOSIS — Z87442 Personal history of urinary calculi: Secondary | ICD-10-CM | POA: Diagnosis not present

## 2023-10-30 DIAGNOSIS — K388 Other specified diseases of appendix: Secondary | ICD-10-CM | POA: Diagnosis not present

## 2023-10-30 DIAGNOSIS — Z8249 Family history of ischemic heart disease and other diseases of the circulatory system: Secondary | ICD-10-CM

## 2023-10-30 DIAGNOSIS — Z860101 Personal history of adenomatous and serrated colon polyps: Secondary | ICD-10-CM | POA: Diagnosis not present

## 2023-10-30 LAB — ABO/RH: ABO/RH(D): A POS

## 2023-10-30 SURGERY — COLECTOMY, PARTIAL, ROBOT-ASSISTED, LAPAROSCOPIC
Anesthesia: General

## 2023-10-30 MED ORDER — ROCURONIUM BROMIDE 10 MG/ML (PF) SYRINGE
PREFILLED_SYRINGE | INTRAVENOUS | Status: AC
  Filled 2023-10-30: qty 10

## 2023-10-30 MED ORDER — ORAL CARE MOUTH RINSE: 15.0000 mL | Freq: Once | OROMUCOSAL | Status: AC

## 2023-10-30 MED ORDER — NEOMYCIN SULFATE 500 MG PO TABS: 1000.0000 mg | ORAL_TABLET | ORAL | Status: DC

## 2023-10-30 MED ORDER — ACETAMINOPHEN 500 MG PO TABS
1000.0000 mg | ORAL_TABLET | Freq: Four times a day (QID) | ORAL | Status: DC
  Administered 2023-10-30 – 2023-11-01 (×8): 1000 mg via ORAL
  Filled 2023-10-30 (×8): qty 2

## 2023-10-30 MED ORDER — DEXAMETHASONE SODIUM PHOSPHATE 10 MG/ML IJ SOLN
INTRAMUSCULAR | Status: DC | PRN
  Administered 2023-10-30: 10 mg via INTRAVENOUS

## 2023-10-30 MED ORDER — SODIUM CHLORIDE 0.9 % IV SOLN
2.0000 g | INTRAVENOUS | Status: AC
  Administered 2023-10-30: 2 g via INTRAVENOUS
  Filled 2023-10-30: qty 2

## 2023-10-30 MED ORDER — GABAPENTIN 100 MG PO CAPS
200.0000 mg | ORAL_CAPSULE | ORAL | Status: AC
  Administered 2023-10-30: 200 mg via ORAL
  Filled 2023-10-30: qty 2

## 2023-10-30 MED ORDER — EPHEDRINE 5 MG/ML INJ
INTRAVENOUS | Status: AC
  Filled 2023-10-30: qty 5

## 2023-10-30 MED ORDER — 0.9 % SODIUM CHLORIDE (POUR BTL) OPTIME
TOPICAL | Status: DC | PRN
  Administered 2023-10-30: 2000 mL

## 2023-10-30 MED ORDER — BUPIVACAINE LIPOSOME 1.3 % IJ SUSP
INTRAMUSCULAR | Status: AC
  Filled 2023-10-30: qty 20

## 2023-10-30 MED ORDER — METOPROLOL TARTRATE 5 MG/5ML IV SOLN: 5.0000 mg | Freq: Four times a day (QID) | INTRAVENOUS | Status: DC | PRN

## 2023-10-30 MED ORDER — NAPHAZOLINE-GLYCERIN 0.012-0.25 % OP SOLN: 1.0000 [drp] | Freq: Four times a day (QID) | OPHTHALMIC | Status: DC | PRN

## 2023-10-30 MED ORDER — FENTANYL CITRATE (PF) 100 MCG/2ML IJ SOLN
INTRAMUSCULAR | Status: AC
  Filled 2023-10-30: qty 2

## 2023-10-30 MED ORDER — FENTANYL CITRATE (PF) 250 MCG/5ML IJ SOLN
INTRAMUSCULAR | Status: AC
  Filled 2023-10-30: qty 5

## 2023-10-30 MED ORDER — TRAMADOL HCL 50 MG PO TABS
50.0000 mg | ORAL_TABLET | Freq: Four times a day (QID) | ORAL | Status: DC | PRN
Start: 2023-10-30 — End: 2023-11-01
  Administered 2023-10-31 (×2): 50 mg via ORAL
  Filled 2023-10-30 (×2): qty 1

## 2023-10-30 MED ORDER — ENOXAPARIN SODIUM 40 MG/0.4ML IJ SOSY
40.0000 mg | PREFILLED_SYRINGE | Freq: Once | INTRAMUSCULAR | Status: AC
  Administered 2023-10-30: 40 mg via SUBCUTANEOUS
  Filled 2023-10-30: qty 0.4

## 2023-10-30 MED ORDER — SUGAMMADEX SODIUM 200 MG/2ML IV SOLN
INTRAVENOUS | Status: DC | PRN
  Administered 2023-10-30: 100 mg via INTRAVENOUS
  Administered 2023-10-30: 300 mg via INTRAVENOUS

## 2023-10-30 MED ORDER — ASPIRIN 81 MG PO TBEC
81.0000 mg | DELAYED_RELEASE_TABLET | Freq: Every day | ORAL | Status: DC
  Administered 2023-10-30 – 2023-11-01 (×3): 81 mg via ORAL
  Filled 2023-10-30 (×3): qty 1

## 2023-10-30 MED ORDER — ENSURE PRE-SURGERY PO LIQD: 592.0000 mL | Freq: Once | ORAL | Status: DC

## 2023-10-30 MED ORDER — MIDAZOLAM HCL 5 MG/5ML IJ SOLN
INTRAMUSCULAR | Status: DC | PRN
  Administered 2023-10-30: 2 mg via INTRAVENOUS

## 2023-10-30 MED ORDER — PHENYLEPHRINE 80 MCG/ML (10ML) SYRINGE FOR IV PUSH (FOR BLOOD PRESSURE SUPPORT)
PREFILLED_SYRINGE | INTRAVENOUS | Status: AC
  Filled 2023-10-30: qty 10

## 2023-10-30 MED ORDER — LACTATED RINGERS IV SOLN: INTRAVENOUS | Status: DC

## 2023-10-30 MED ORDER — DIPHENHYDRAMINE HCL 12.5 MG/5ML PO ELIX: 12.5000 mg | ORAL_SOLUTION | Freq: Four times a day (QID) | ORAL | Status: DC | PRN

## 2023-10-30 MED ORDER — HYDROMORPHONE HCL 1 MG/ML IJ SOLN
INTRAMUSCULAR | Status: DC | PRN
Start: 2023-10-30 — End: 2023-10-30
  Administered 2023-10-30 (×4): .5 mg via INTRAVENOUS

## 2023-10-30 MED ORDER — ONDANSETRON HCL 4 MG PO TABS: 4.0000 mg | ORAL_TABLET | Freq: Four times a day (QID) | ORAL | Status: DC | PRN

## 2023-10-30 MED ORDER — BUPIVACAINE-EPINEPHRINE (PF) 0.25% -1:200000 IJ SOLN
INTRAMUSCULAR | Status: DC | PRN
  Administered 2023-10-30: 60 mL

## 2023-10-30 MED ORDER — KETOROLAC TROMETHAMINE 30 MG/ML IJ SOLN
INTRAMUSCULAR | Status: AC
  Filled 2023-10-30: qty 1

## 2023-10-30 MED ORDER — PROCHLORPERAZINE EDISYLATE 10 MG/2ML IJ SOLN: 5.0000 mg | Freq: Four times a day (QID) | INTRAMUSCULAR | Status: DC | PRN

## 2023-10-30 MED ORDER — PROPOFOL 10 MG/ML IV BOLUS
INTRAVENOUS | Status: DC | PRN
  Administered 2023-10-30: 200 mg via INTRAVENOUS

## 2023-10-30 MED ORDER — HYDROMORPHONE HCL 2 MG/ML IJ SOLN
INTRAMUSCULAR | Status: AC
  Filled 2023-10-30: qty 1

## 2023-10-30 MED ORDER — SIMETHICONE 80 MG PO CHEW: 40.0000 mg | CHEWABLE_TABLET | Freq: Four times a day (QID) | ORAL | Status: DC | PRN

## 2023-10-30 MED ORDER — BUPIVACAINE LIPOSOME 1.3 % IJ SUSP
INTRAMUSCULAR | Status: DC | PRN
  Administered 2023-10-30: 20 mL

## 2023-10-30 MED ORDER — BUPIVACAINE-EPINEPHRINE (PF) 0.25% -1:200000 IJ SOLN
INTRAMUSCULAR | Status: AC
  Filled 2023-10-30: qty 30

## 2023-10-30 MED ORDER — FLUTICASONE PROPIONATE 50 MCG/ACT NA SUSP
1.0000 | Freq: Every day | NASAL | Status: DC
Start: 2023-10-30 — End: 2023-11-01
  Filled 2023-10-30: qty 16

## 2023-10-30 MED ORDER — ONDANSETRON HCL 4 MG/2ML IJ SOLN
INTRAMUSCULAR | Status: AC
  Filled 2023-10-30: qty 2

## 2023-10-30 MED ORDER — SIMVASTATIN 20 MG PO TABS
40.0000 mg | ORAL_TABLET | Freq: Every day | ORAL | Status: DC
  Administered 2023-10-30 – 2023-10-31 (×2): 40 mg via ORAL
  Filled 2023-10-30 (×2): qty 2

## 2023-10-30 MED ORDER — BISACODYL 5 MG PO TBEC
20.0000 mg | DELAYED_RELEASE_TABLET | Freq: Once | ORAL | Status: DC
Start: 2023-10-30 — End: 2023-10-30

## 2023-10-30 MED ORDER — LORATADINE 10 MG PO TABS
10.0000 mg | ORAL_TABLET | Freq: Every day | ORAL | Status: DC
  Administered 2023-10-30 – 2023-11-01 (×3): 10 mg via ORAL
  Filled 2023-10-30 (×3): qty 1

## 2023-10-30 MED ORDER — PROCHLORPERAZINE MALEATE 10 MG PO TABS: 10.0000 mg | ORAL_TABLET | Freq: Four times a day (QID) | ORAL | Status: DC | PRN

## 2023-10-30 MED ORDER — SODIUM CHLORIDE 0.9% FLUSH
3.0000 mL | Freq: Two times a day (BID) | INTRAVENOUS | Status: DC
  Administered 2023-10-30 – 2023-11-01 (×4): 3 mL via INTRAVENOUS

## 2023-10-30 MED ORDER — LIDOCAINE HCL (PF) 2 % IJ SOLN
INTRAMUSCULAR | Status: DC | PRN
  Administered 2023-10-30: 40 mg/kg/h

## 2023-10-30 MED ORDER — METHOCARBAMOL 1000 MG/10ML IJ SOLN: 1000.0000 mg | Freq: Four times a day (QID) | INTRAMUSCULAR | Status: DC | PRN

## 2023-10-30 MED ORDER — PROPOFOL 10 MG/ML IV BOLUS
INTRAVENOUS | Status: AC
  Filled 2023-10-30: qty 20

## 2023-10-30 MED ORDER — DIPHENHYDRAMINE HCL 50 MG/ML IJ SOLN: 12.5000 mg | Freq: Four times a day (QID) | INTRAMUSCULAR | Status: DC | PRN

## 2023-10-30 MED ORDER — LACTATED RINGERS IV SOLN: Freq: Three times a day (TID) | INTRAVENOUS | Status: DC | PRN

## 2023-10-30 MED ORDER — DOXAZOSIN MESYLATE 8 MG PO TABS
8.0000 mg | ORAL_TABLET | Freq: Every day | ORAL | Status: DC
  Administered 2023-10-30 – 2023-10-31 (×2): 8 mg via ORAL
  Filled 2023-10-30 (×2): qty 1

## 2023-10-30 MED ORDER — FENTANYL CITRATE (PF) 250 MCG/5ML IJ SOLN
INTRAMUSCULAR | Status: DC | PRN
  Administered 2023-10-30: 150 ug via INTRAVENOUS
  Administered 2023-10-30 (×4): 50 ug via INTRAVENOUS

## 2023-10-30 MED ORDER — METRONIDAZOLE 500 MG PO TABS: 1000.0000 mg | ORAL_TABLET | ORAL | Status: DC

## 2023-10-30 MED ORDER — ENSURE SURGERY PO LIQD
237.0000 mL | Freq: Two times a day (BID) | ORAL | Status: DC
  Administered 2023-10-31 (×2): 237 mL via ORAL

## 2023-10-30 MED ORDER — ONDANSETRON HCL 4 MG/2ML IJ SOLN: 4.0000 mg | Freq: Four times a day (QID) | INTRAMUSCULAR | Status: DC | PRN

## 2023-10-30 MED ORDER — PHENYLEPHRINE 80 MCG/ML (10ML) SYRINGE FOR IV PUSH (FOR BLOOD PRESSURE SUPPORT)
PREFILLED_SYRINGE | INTRAVENOUS | Status: DC | PRN
  Administered 2023-10-30: 40 ug via INTRAVENOUS

## 2023-10-30 MED ORDER — DEXMEDETOMIDINE HCL IN NACL 80 MCG/20ML IV SOLN
INTRAVENOUS | Status: DC | PRN
  Administered 2023-10-30: 4 ug via INTRAVENOUS
  Administered 2023-10-30: 8 ug via INTRAVENOUS

## 2023-10-30 MED ORDER — SODIUM CHLORIDE 0.9 % IV SOLN: 250.0000 mL | INTRAVENOUS | Status: DC | PRN

## 2023-10-30 MED ORDER — DROPERIDOL 2.5 MG/ML IJ SOLN: 0.6250 mg | Freq: Once | INTRAMUSCULAR | Status: DC | PRN

## 2023-10-30 MED ORDER — ROCURONIUM BROMIDE 100 MG/10ML IV SOLN
INTRAVENOUS | Status: DC | PRN
  Administered 2023-10-30: 20 mg via INTRAVENOUS
  Administered 2023-10-30: 10 mg via INTRAVENOUS
  Administered 2023-10-30: 70 mg via INTRAVENOUS
  Administered 2023-10-30: 10 mg via INTRAVENOUS

## 2023-10-30 MED ORDER — DEXAMETHASONE SODIUM PHOSPHATE 10 MG/ML IJ SOLN
INTRAMUSCULAR | Status: AC
  Filled 2023-10-30: qty 1

## 2023-10-30 MED ORDER — LIDOCAINE HCL (PF) 2 % IJ SOLN
INTRAMUSCULAR | Status: AC
Start: 2023-10-30 — End: ?
  Filled 2023-10-30: qty 5

## 2023-10-30 MED ORDER — MENTHOL 3 MG MT LOZG: 1.0000 | LOZENGE | OROMUCOSAL | Status: DC | PRN

## 2023-10-30 MED ORDER — MIDAZOLAM HCL 2 MG/2ML IJ SOLN
INTRAMUSCULAR | Status: AC
  Filled 2023-10-30: qty 2

## 2023-10-30 MED ORDER — OXYCODONE HCL 5 MG/5ML PO SOLN: 5.0000 mg | Freq: Once | ORAL | Status: DC | PRN

## 2023-10-30 MED ORDER — MELATONIN 3 MG PO TABS: 3.0000 mg | ORAL_TABLET | Freq: Every evening | ORAL | Status: DC | PRN

## 2023-10-30 MED ORDER — KCL IN DEXTROSE-NACL 20-5-0.45 MEQ/L-%-% IV SOLN
INTRAVENOUS | Status: DC
  Filled 2023-10-30: qty 1000

## 2023-10-30 MED ORDER — CALCIUM POLYCARBOPHIL 625 MG PO TABS
625.0000 mg | ORAL_TABLET | Freq: Two times a day (BID) | ORAL | Status: DC
  Administered 2023-10-30 – 2023-11-01 (×4): 625 mg via ORAL
  Filled 2023-10-30 (×4): qty 1

## 2023-10-30 MED ORDER — CELECOXIB 200 MG PO CAPS
200.0000 mg | ORAL_CAPSULE | ORAL | Status: AC
  Administered 2023-10-30: 200 mg via ORAL
  Filled 2023-10-30: qty 1

## 2023-10-30 MED ORDER — SODIUM CHLORIDE 0.9 % IV SOLN
2.0000 g | Freq: Two times a day (BID) | INTRAVENOUS | Status: AC
  Administered 2023-10-31: 2 g via INTRAVENOUS
  Filled 2023-10-30: qty 2

## 2023-10-30 MED ORDER — PHENOL 1.4 % MT LIQD: 2.0000 | OROMUCOSAL | Status: DC | PRN

## 2023-10-30 MED ORDER — ACETAMINOPHEN 10 MG/ML IV SOLN: 1000.0000 mg | Freq: Once | INTRAVENOUS | Status: DC | PRN

## 2023-10-30 MED ORDER — ALVIMOPAN 12 MG PO CAPS
12.0000 mg | ORAL_CAPSULE | Freq: Two times a day (BID) | ORAL | Status: DC
  Filled 2023-10-30 (×2): qty 1

## 2023-10-30 MED ORDER — SERTRALINE HCL 50 MG PO TABS
50.0000 mg | ORAL_TABLET | Freq: Every day | ORAL | Status: DC
  Administered 2023-10-30 – 2023-11-01 (×3): 50 mg via ORAL
  Filled 2023-10-30 (×3): qty 1

## 2023-10-30 MED ORDER — ENOXAPARIN SODIUM 40 MG/0.4ML IJ SOSY
40.0000 mg | PREFILLED_SYRINGE | INTRAMUSCULAR | Status: DC
  Administered 2023-10-31 – 2023-11-01 (×2): 40 mg via SUBCUTANEOUS
  Filled 2023-10-30 (×2): qty 0.4

## 2023-10-30 MED ORDER — LACTATED RINGERS IV SOLN: INTRAVENOUS | Status: DC | PRN

## 2023-10-30 MED ORDER — ALVIMOPAN 12 MG PO CAPS
12.0000 mg | ORAL_CAPSULE | ORAL | Status: AC
  Administered 2023-10-30: 12 mg via ORAL
  Filled 2023-10-30: qty 1

## 2023-10-30 MED ORDER — HYDRALAZINE HCL 20 MG/ML IJ SOLN: 10.0000 mg | INTRAMUSCULAR | Status: DC | PRN

## 2023-10-30 MED ORDER — MAGIC MOUTHWASH: 15.0000 mL | Freq: Four times a day (QID) | ORAL | Status: DC | PRN

## 2023-10-30 MED ORDER — SODIUM CHLORIDE 0.9% FLUSH: 3.0000 mL | INTRAVENOUS | Status: DC | PRN

## 2023-10-30 MED ORDER — BUPIVACAINE LIPOSOME 1.3 % IJ SUSP: 20.0000 mL | Freq: Once | INTRAMUSCULAR | Status: DC

## 2023-10-30 MED ORDER — SALINE SPRAY 0.65 % NA SOLN: 1.0000 | Freq: Four times a day (QID) | NASAL | Status: DC | PRN

## 2023-10-30 MED ORDER — CHLORHEXIDINE GLUCONATE 0.12 % MT SOLN
15.0000 mL | Freq: Once | OROMUCOSAL | Status: AC
  Administered 2023-10-30: 15 mL via OROMUCOSAL

## 2023-10-30 MED ORDER — LISINOPRIL 20 MG PO TABS
20.0000 mg | ORAL_TABLET | Freq: Every day | ORAL | Status: DC
  Administered 2023-10-31 – 2023-11-01 (×2): 20 mg via ORAL
  Filled 2023-10-30 (×3): qty 1

## 2023-10-30 MED ORDER — ENSURE PRE-SURGERY PO LIQD
296.0000 mL | Freq: Once | ORAL | Status: DC
Start: 2023-10-31 — End: 2023-10-30

## 2023-10-30 MED ORDER — ACETAMINOPHEN 500 MG PO TABS
1000.0000 mg | ORAL_TABLET | ORAL | Status: AC
  Administered 2023-10-30: 1000 mg via ORAL
  Filled 2023-10-30: qty 2

## 2023-10-30 MED ORDER — POLYETHYLENE GLYCOL 3350 17 GM/SCOOP PO POWD
1.0000 | Freq: Once | ORAL | Status: DC
Start: 2023-10-30 — End: 2023-10-30

## 2023-10-30 MED ORDER — FENTANYL CITRATE PF 50 MCG/ML IJ SOSY: 25.0000 ug | PREFILLED_SYRINGE | INTRAMUSCULAR | Status: DC | PRN

## 2023-10-30 MED ORDER — ALUM & MAG HYDROXIDE-SIMETH 200-200-20 MG/5ML PO SUSP: 30.0000 mL | Freq: Four times a day (QID) | ORAL | Status: DC | PRN

## 2023-10-30 MED ORDER — HYDROMORPHONE HCL 1 MG/ML IJ SOLN: 0.5000 mg | INTRAMUSCULAR | Status: DC | PRN

## 2023-10-30 MED ORDER — GABAPENTIN 100 MG PO CAPS
200.0000 mg | ORAL_CAPSULE | Freq: Every day | ORAL | Status: DC
  Administered 2023-10-30 – 2023-10-31 (×2): 200 mg via ORAL
  Filled 2023-10-30 (×2): qty 2

## 2023-10-30 MED ORDER — DEXMEDETOMIDINE HCL IN NACL 80 MCG/20ML IV SOLN
INTRAVENOUS | Status: AC
  Filled 2023-10-30: qty 20

## 2023-10-30 MED ORDER — OXYCODONE HCL 5 MG PO TABS: 5.0000 mg | ORAL_TABLET | Freq: Once | ORAL | Status: DC | PRN

## 2023-10-30 MED ORDER — EPINEPHRINE 0.3 MG/0.3ML IJ SOAJ
0.3000 mg | INTRAMUSCULAR | Status: DC | PRN
Start: 2023-10-30 — End: 2023-11-01

## 2023-10-30 MED ORDER — METHOCARBAMOL 500 MG PO TABS: 1000.0000 mg | ORAL_TABLET | Freq: Four times a day (QID) | ORAL | Status: DC | PRN

## 2023-10-30 MED ORDER — ONDANSETRON HCL 4 MG/2ML IJ SOLN
INTRAMUSCULAR | Status: DC | PRN
  Administered 2023-10-30: 4 mg via INTRAVENOUS

## 2023-10-30 SURGICAL SUPPLY — 98 items
APPLIER CLIP 5 13 M/L LIGAMAX5 (MISCELLANEOUS) IMPLANT
APPLIER CLIP ROT 10 11.4 M/L (STAPLE) IMPLANT
BAG COUNTER SPONGE SURGICOUNT (BAG) ×1 IMPLANT
BLADE EXTENDED COATED 6.5IN (ELECTRODE) IMPLANT
CANNULA REDUCER 12-8 DVNC XI (CANNULA) IMPLANT
CELLS DAT CNTRL 66122 CELL SVR (MISCELLANEOUS) IMPLANT
CHLORAPREP W/TINT 26 (MISCELLANEOUS) IMPLANT
CLIP APPLIE 5 13 M/L LIGAMAX5 (MISCELLANEOUS) IMPLANT
CLIP APPLIE ROT 10 11.4 M/L (STAPLE) IMPLANT
COVER SURGICAL LIGHT HANDLE (MISCELLANEOUS) ×2 IMPLANT
COVER TIP SHEARS 8 DVNC (MISCELLANEOUS) ×1 IMPLANT
DEFOGGER SCOPE WARMER CLEARIFY (MISCELLANEOUS) IMPLANT
DEVICE TROCAR PUNCTURE CLOSURE (ENDOMECHANICALS) IMPLANT
DRAIN CHANNEL 19F RND (DRAIN) IMPLANT
DRAPE ARM DVNC X/XI (DISPOSABLE) ×4 IMPLANT
DRAPE COLUMN DVNC XI (DISPOSABLE) ×1 IMPLANT
DRAPE SURG IRRIG POUCH 19X23 (DRAPES) ×1 IMPLANT
DRIVER NDL LRG 8 DVNC XI (INSTRUMENTS) ×1 IMPLANT
DRIVER NDLE LRG 8 DVNC XI (INSTRUMENTS) ×1 IMPLANT
DRSG OPSITE POSTOP 4X10 (GAUZE/BANDAGES/DRESSINGS) IMPLANT
DRSG OPSITE POSTOP 4X6 (GAUZE/BANDAGES/DRESSINGS) IMPLANT
DRSG OPSITE POSTOP 4X8 (GAUZE/BANDAGES/DRESSINGS) IMPLANT
DRSG TEGADERM 2-3/8X2-3/4 SM (GAUZE/BANDAGES/DRESSINGS) ×5 IMPLANT
DRSG TEGADERM 4X4.75 (GAUZE/BANDAGES/DRESSINGS) IMPLANT
ELECT PENCIL ROCKER SW 15FT (MISCELLANEOUS) ×1 IMPLANT
ELECT REM PT RETURN 15FT ADLT (MISCELLANEOUS) ×1 IMPLANT
ENDOLOOP SUT PDS II 0 18 (SUTURE) IMPLANT
EVACUATOR SILICONE 100CC (DRAIN) IMPLANT
GAUZE SPONGE 2X2 8PLY STRL LF (GAUZE/BANDAGES/DRESSINGS) ×1 IMPLANT
GLOVE ECLIPSE 8.0 STRL XLNG CF (GLOVE) ×3 IMPLANT
GLOVE INDICATOR 8.0 STRL GRN (GLOVE) ×3 IMPLANT
GOWN SRG XL LVL 4 BRTHBL STRL (GOWNS) ×1 IMPLANT
GOWN STRL REUS W/ TWL XL LVL3 (GOWN DISPOSABLE) ×4 IMPLANT
GRASPER SUT TROCAR 14GX15 (MISCELLANEOUS) IMPLANT
GRASPER TIP-UP FEN DVNC XI (INSTRUMENTS) ×1 IMPLANT
HOLDER FOLEY CATH W/STRAP (MISCELLANEOUS) ×1 IMPLANT
IRRIG SUCT STRYKERFLOW 2 WTIP (MISCELLANEOUS) ×1 IMPLANT
IRRIGATION SUCT STRKRFLW 2 WTP (MISCELLANEOUS) ×1 IMPLANT
KIT PROCEDURE DVNC SI (MISCELLANEOUS) IMPLANT
KIT SIGMOIDOSCOPE (SET/KITS/TRAYS/PACK) IMPLANT
KIT TURNOVER KIT A (KITS) IMPLANT
NDL INSUFFLATION 14GA 120MM (NEEDLE) ×1 IMPLANT
NEEDLE INSUFFLATION 14GA 120MM (NEEDLE) ×1 IMPLANT
PACK CARDIOVASCULAR III (CUSTOM PROCEDURE TRAY) ×1 IMPLANT
PACK COLON (CUSTOM PROCEDURE TRAY) ×1 IMPLANT
PAD POSITIONING PINK XL (MISCELLANEOUS) ×1 IMPLANT
PROTECTOR NERVE ULNAR (MISCELLANEOUS) ×2 IMPLANT
RELOAD STAPLE 45 3.5 BLU DVNC (STAPLE) IMPLANT
RELOAD STAPLE 45 4.3 GRN DVNC (STAPLE) IMPLANT
RELOAD STAPLE 60 2.5 WHT DVNC (STAPLE) IMPLANT
RELOAD STAPLE 60 3.5 BLU DVNC (STAPLE) IMPLANT
RELOAD STAPLE 60 4.3 GRN DVNC (STAPLE) IMPLANT
RELOAD STAPLER 2.5X60 WHT DVNC (STAPLE) ×1 IMPLANT
RELOAD STAPLER 3.5X45 BLU DVNC (STAPLE) IMPLANT
RELOAD STAPLER 3.5X60 BLU DVNC (STAPLE) ×2 IMPLANT
RELOAD STAPLER 4.3X45 GRN DVNC (STAPLE) IMPLANT
RELOAD STAPLER 4.3X60 GRN DVNC (STAPLE) IMPLANT
RETRACTOR WND ALEXIS 18 MED (MISCELLANEOUS) IMPLANT
RTRCTR WOUND ALEXIS 18CM MED (MISCELLANEOUS) IMPLANT
SCISSORS LAP 5X35 DISP (ENDOMECHANICALS) ×1 IMPLANT
SCISSORS MNPLR CVD DVNC XI (INSTRUMENTS) ×1 IMPLANT
SEAL UNIV 5-12 XI (MISCELLANEOUS) ×4 IMPLANT
SEALER VESSEL EXT DVNC XI (MISCELLANEOUS) ×1 IMPLANT
SOL ELECTROSURG ANTI STICK (MISCELLANEOUS) ×1 IMPLANT
SOLUTION ELECTROSURG ANTI STCK (MISCELLANEOUS) ×1 IMPLANT
SPIKE FLUID TRANSFER (MISCELLANEOUS) ×1 IMPLANT
STAPLER 45 SUREFORM DVNC (STAPLE) IMPLANT
STAPLER 60 SUREFORM DVNC (STAPLE) IMPLANT
STAPLER ECHELON POWER CIR 29 (STAPLE) IMPLANT
STAPLER ECHELON POWER CIR 31 (STAPLE) IMPLANT
STAPLER RELOAD 2.5X60 WHT DVNC (STAPLE) ×1 IMPLANT
STAPLER RELOAD 3.5X45 BLU DVNC (STAPLE) IMPLANT
STAPLER RELOAD 3.5X60 BLU DVNC (STAPLE) ×2 IMPLANT
STAPLER RELOAD 4.3X45 GRN DVNC (STAPLE) IMPLANT
STAPLER RELOAD 4.3X60 GRN DVNC (STAPLE) IMPLANT
STOPCOCK 4 WAY LG BORE MALE ST (IV SETS) ×2 IMPLANT
SURGILUBE 2OZ TUBE FLIPTOP (MISCELLANEOUS) IMPLANT
SUT MNCRL AB 4-0 PS2 18 (SUTURE) ×1 IMPLANT
SUT PDS AB 1 CT1 27 (SUTURE) ×2 IMPLANT
SUT PROLENE 0 CT 2 (SUTURE) IMPLANT
SUT PROLENE 2 0 KS (SUTURE) IMPLANT
SUT PROLENE 2 0 SH DA (SUTURE) IMPLANT
SUT SILK 2 0 SH CR/8 (SUTURE) IMPLANT
SUT SILK 3 0 SH CR/8 (SUTURE) ×1 IMPLANT
SUT V-LOC BARB 180 2/0GR6 GS22 (SUTURE) IMPLANT
SUT VIC AB 3-0 SH 18 (SUTURE) IMPLANT
SUT VIC AB 3-0 SH 27XBRD (SUTURE) IMPLANT
SUT VICRYL 0 UR6 27IN ABS (SUTURE) IMPLANT
SUTURE V-LC BRB 180 2/0GR6GS22 (SUTURE) IMPLANT
SYR 20ML ECCENTRIC (SYRINGE) ×1 IMPLANT
SYS LAPSCP GELPORT 120MM (MISCELLANEOUS) IMPLANT
SYS WOUND ALEXIS 18CM MED (MISCELLANEOUS) ×1 IMPLANT
SYSTEM LAPSCP GELPORT 120MM (MISCELLANEOUS) IMPLANT
SYSTEM WOUND ALEXIS 18CM MED (MISCELLANEOUS) ×1 IMPLANT
TRAY FOLEY MTR SLVR 16FR STAT (SET/KITS/TRAYS/PACK) ×1 IMPLANT
TROCAR ADV FIXATION 5X100MM (TROCAR) ×1 IMPLANT
TUBING CONNECTING 10 (TUBING) ×2 IMPLANT
TUBING INSUFFLATION 10FT LAP (TUBING) ×1 IMPLANT

## 2023-10-30 NOTE — Anesthesia Postprocedure Evaluation (Signed)
 Anesthesia Post Note  Patient: Jim Goodwin  Procedure(s) Performed: ROBOTIC PROXIMAL "RIGHT" COLECTOMY WITH ANASTOMOSIS PRIMARY UMBILICAL HERNIA REPAIR TRANSVERSUS ABDOMINIS PLANE (TAP) BLOCK - BILATERAL     Patient location during evaluation: PACU Anesthesia Type: General Level of consciousness: awake and alert Pain management: pain level controlled Vital Signs Assessment: post-procedure vital signs reviewed and stable Respiratory status: spontaneous breathing, nonlabored ventilation, respiratory function stable and patient connected to nasal cannula oxygen Cardiovascular status: blood pressure returned to baseline and stable Postop Assessment: no apparent nausea or vomiting Anesthetic complications: no  No notable events documented.  Last Vitals:  Vitals:   10/30/23 1603 10/30/23 1733  BP: 129/76 121/83  Pulse: (!) 105 (!) 101  Resp: 18 16  Temp:  37 C  SpO2: 96% 91%    Last Pain:  Vitals:   10/30/23 1733  TempSrc: Oral  PainSc:                  Jim Goodwin

## 2023-10-30 NOTE — H&P (Signed)
 10/30/2023     REFERRING PHYSICIAN: Lynann Bologna, MD  Patient Care Team: Deeann Saint, MD as PCP - General (Family Medicine) Lynann Bologna, MD (Gastroenterology) Michaell Cowing, Shawn Route, MD as Consulting Provider (General Surgery)  PROVIDER: Jarrett Soho, MD  [facility] MRN: [id number] DOB: 16-Sep-1975  SUBJECTIVE   Chief Complaint: New Consultation (HEPATIC FLEXURE MASS)   Jim Goodwin is a 48 y.o. male  who is seen today as an office consultation  at the request of DrChales Abrahams  for evaluation of large mass in hepatic flexure.  History of Present Illness:  48 year old male. Found to have positive Cologuard. A paternal uncle was diagnosed with colon cancer in his 82s but no other family history of issues. He thinks his mom had a small bowel mass removed but is not certain if that is a carcinoid. Not a cancer. Pt uncle underwent screening colonoscopy. A few polyps removed. Larger mass noted by Dr Chales Abrahams in the proximal colon presumed to be at the hepatic flexure. Biopsies done. Tattooing to just distal to it. Consistent with adenomatous tissue. Felt to be too large to safely remove. Therefore surgery consultation recommended. 1 year follow-up colonoscopy recommended  Patient comes in with his wife. He is never had an abdominal surgery. r. He has no history of inflammatory bowel disease. He moves his bowels usually twice a day. No bouts of constipation or diarrhea. He does have some hypertension and high blood pressure but no diabetes. No sleep apnea. Never any cardiovascular issues. He has not smoked in over 15 years. Can walk at least 30 minutes without any difficulty.  Medical History:  Past Medical History:  Diagnosis Date  Anxiety  Hyperlipidemia  Hypertension   Patient Active Problem List  Diagnosis  Mass of colon  History of adenomatous polyp of colon  Family history of colon cancer  BMI 50.0-59.9, adult (CMS/HHS-HCC)   Past Surgical History:  Procedure  Laterality Date  JOINT REPLACEMENT    Allergies  Allergen Reactions  Penicillin G Procaine Other (See Comments)  GI upset   Current Outpatient Medications on File Prior to Visit  Medication Sig Dispense Refill  aspirin 81 MG EC tablet Take 81 mg by mouth once daily  cetirizine (ZYRTEC) 10 MG tablet Take 10 mg by mouth once daily  doxazosin (CARDURA) 8 MG tablet Take 8 mg by mouth  EPINEPHrine (EPIPEN) 0.3 mg/0.3 mL auto-injector Inject 0.3 mg into the muscle  lisinopriL (ZESTRIL) 20 MG tablet Take 20 mg by mouth once daily  LORazepam (ATIVAN) 1 MG tablet One half to one tablet one hour prior to flying  sertraline (ZOLOFT) 50 MG tablet Take 50 mg by mouth once daily  simvastatin (ZOCOR) 40 MG tablet Take 40 mg by mouth at bedtime   No current facility-administered medications on file prior to visit.   Family History  Problem Relation Age of Onset  Hyperlipidemia (Elevated cholesterol) Mother  High blood pressure (Hypertension) Mother  High blood pressure (Hypertension) Father  Hyperlipidemia (Elevated cholesterol) Father  High blood pressure (Hypertension) Sister    Social History   Tobacco Use  Smoking Status Former  Types: Cigarettes  Smokeless Tobacco Never    Social History   Socioeconomic History  Marital status: Married  Tobacco Use  Smoking status: Former  Types: Cigarettes  Smokeless tobacco: Never  Substance and Sexual Activity  Alcohol use: Yes  Drug use: Never   Social Drivers of Corporate investment banker Strain: Low Risk (05/06/2023)  Received from [facility]  Overall Financial Resource Strain (CARDIA)  Difficulty of Paying Living Expenses: Not hard at all  Food Insecurity: No Food Insecurity (05/06/2023)  Received from [facility]  Hunger Vital Sign  Worried About Running Out of Food in the Last Year: Never true  Ran Out of Food in the Last Year: Never true  Transportation Needs: No Transportation Needs (05/06/2023)  Received from [facility] - Transportation  Lack of Transportation (Medical): No  Lack of Transportation (Non-Medical): No  Physical Activity: Insufficiently Active (05/06/2023)  Received [facility]  Exercise Vital Sign  Days of Exercise per Week: 3 days  Minutes of Exercise per Session: 30 min  Stress: No Stress Concern Present (05/06/2023)  Received from [facility] - Occupational Stress Questionnaire  Feeling of Stress : Only a little  Social Connections: Moderately Integrated (05/06/2023)  Received from [facility]  Social Connection and Isolation Panel [NHANES]  Frequency of Communication with Friends and Family: Twice a week  Frequency of Social Gatherings with Friends and Family: Once a week  Attends Religious Services: 1 to 4 times per year  Active Member of Clubs or Organizations: No  Marital Status: Married  Housing Stability: Unknown (09/25/2023)  Housing Stability Vital Sign  Homeless in the Last Year: No   ############################################################  Review of Systems: A complete review of systems (ROS) was obtained from the patient.  We have reviewed this information and discussed as appropriate with the patient.  See HPI as well for other pertinent ROS.  Constitutional: No fevers, chills, sweats. Weight stable Eyes: No vision changes, No discharge HENT: No sore throats, nasal drainage Lymph: No neck swelling, No bruising easily Pulmonary: No cough, productive sputum CV: No orthopnea, PND . No exertional chest/neck/shoulder/arm pain. Patient can walk 30 minutes without difficulty.   GI: No personal nor family history of GI/colon cancer, inflammatory bowel disease, irritable bowel syndrome, allergy such as Celiac Sprue, dietary/dairy problems, colitis, ulcers nor gastritis. No recent sick contacts/gastroenteritis. No travel outside the country. No changes in diet.  Renal: No UTIs, No hematuria Genital: No  drainage, bleeding, masses Musculoskeletal: No severe joint pain. Good ROM major joints Skin: No sores or lesions Heme/Lymph: No easy bleeding. No swollen lymph nodes Neuro: No active seizures. No facial droop Psych: No hallucinations. No agitation  OBJECTIVE   Vitals:  09/25/23 1332  BP: 130/85  Pulse: (!) 120  Temp: 36.7 C (98 F)  SpO2: 96%  Weight: (!) 156.5 kg (345 lb)  Height: 175.3 cm (5\' 9" )   Body mass index is 50.95 kg/m.  PHYSICAL EXAM:  Constitutional: Not cachectic. Hygeine adequate. Vitals signs as above.  Eyes: Wears glasses - vision corrected,Pupils reactive, normal extraocular movements. Sclera nonicteric Neuro: CN II-XII intact. No major focal sensory defects. No major motor deficits. Lymph: No head/neck/groin lymphadenopathy Psych: No severe agitation. No severe anxiety. Judgment & insight Adequate, Oriented x4, HENT: Normocephalic, Mucus membranes moist. No thrush. Hearing: adequate Neck: Supple, No tracheal deviation. No obvious thyromegaly Chest: No pain to chest wall compression. Good respiratory excursion. No audible wheezing CV: Pulses intact. regular. No major extremity edema Ext: No obvious deformity or contracture. Edema: Not present. No cyanosis Skin: No major subcutaneous nodules. Warm and dry Musculoskeletal: Severe joint rigidity not present. No obvious clubbing. No digital petechiae. Mobility: no assist device moving easily without restrictions  Abdomen: Obese with panniculus Soft. Nondistended. Nontender. Hernia: Not present. Diastasis recti: Not present. No hepatomegaly. No splenomegaly.  Genital/Pelvic: Inguinal  hernia: Not present. Inguinal lymph nodes: without lymphadenopathy nor hidradenitis.   Rectal: (Deferred)    ###################################################################  Labs, Imaging and Diagnostic Testing:  Located in 'Care Everywhere' section of Epic EMR chart  PRIOR [facility] CLINIC NOTES:  Not  applicable  SURGERY NOTES:  Not applicable  PATHOLOGY:  Located in 'Care Everywhere' section of Epic EMR chart  Assessment and Plan:  DIAGNOSES:  Diagnoses and all orders for this visit:  Mass of colon  History of adenomatous polyp of colon  BMI 50.0-59.9, adult (CMS/HHS-HCC)  Family history of colon cancer    ASSESSMENT/PLAN  Pleasant obese male positive Cologuard found to have a polypoid mass in the colon showing adenomatous tissue. Best guess is a hepatic flexure. Tattoo just distal to it. I think he would benefit from segmental colonic resection. Hopefully just a proximal right colon with intracorporeal anastomosis. Will place in lithotomy and have endoscopy available in case it is not easy to find given his obesity. It was tattooed just distal itself and not too bad.  The anatomy & physiology of the digestive tract was discussed. The pathophysiology of the colon was discussed. Natural history risks without surgery was discussed. I feel the risks of no intervention will lead to serious problems that outweigh the operative risks; therefore, I recommended a partial colectomy to remove the pathology. Minimally invasive (Robotic/Laparoscopic) & open techniques were discussed.   Risks such as bleeding, infection, abscess, leak, reoperation, injury to other organs, need for repair of tissues / organs, possible ostomy, hernia, heart attack, stroke, death, and other risks were discussed. I noted a good likelihood this will help address the problem. Goals of post-operative recovery were discussed as well. Need for adequate nutrition, daily bowel regimen and healthy physical activity, to optimize recovery was noted as well. We will work to minimize complications. Educational materials were available as well. Questions were answered. The patient expresses understanding & wishes to proceed with surgery.  He has good performance status and does not smoke. No cardiac or pulmonary issues. Do  not think he needs extra clearances.  Ardeth Sportsman, MD, FACS, MASCRS Esophageal, Gastrointestinal & Colorectal Surgery Robotic and Minimally Invasive Surgery  [facility] [address], [address] [place], [place] [address] [phone] Fax [phone] Main  CONTACT INFORMATION: Weekday (9AM-5PM): Call [facility] main office at [phone] Weeknight (5PM-9AM) or Weekend/Holiday: Check EPIC "Web Links" tab & use "AMION" (password " [email or web address]") for General Surgery [facility] coverage  Please, DO NOT use SecureChat  (it is not reliable communication to reach operating surgeons & will lead to a delay in care).   Epic staff messaging available for outptient concerns needing 1-2 business day response.

## 2023-10-30 NOTE — Transfer of Care (Signed)
 Immediate Anesthesia Transfer of Care Note  Patient: Jim Goodwin  Procedure(s) Performed: COLECTOMY, PARTIAL, ROBOT-ASSISTED, LAPAROSCOPIC  Patient Location: PACU  Anesthesia Type:General  Level of Consciousness: awake and drowsy  Airway & Oxygen Therapy: Patient Spontanous Breathing and Patient connected to face mask oxygen  Post-op Assessment: Report given to RN and Post -op Vital signs reviewed and stable  Post vital signs: Reviewed and stable  Last Vitals:  Vitals Value Taken Time  BP 157/91 10/30/23 1439  Temp 97   Pulse 104 10/30/23 1441  Resp 10 10/30/23 1441  SpO2 100 % 10/30/23 1441  Vitals shown include unfiled device data.  Last Pain:  Vitals:   10/30/23 0934  TempSrc:   PainSc: 0-No pain         Complications: No notable events documented.

## 2023-10-30 NOTE — Interval H&P Note (Signed)
 History and Physical Interval Note:  10/30/2023 11:02 AM  Jim Goodwin  has presented today for surgery, with the diagnosis of COLYN POLYP UNRESECTABLE BY COLONOSCOPY.  The various methods of treatment have been discussed with the patient and family. After consideration of risks, benefits and other options for treatment, the patient has consented to  Procedure(s) with comments: COLECTOMY, PARTIAL, ROBOT-ASSISTED, LAPAROSCOPIC (N/A) - ROBOTIC COLECTOMY PROXIMAL 180 as a surgical intervention.  The patient's history has been reviewed, patient examined, no change in status, stable for surgery.  I have reviewed the patient's chart and labs.  Questions were answered to the patient's satisfaction.    I have re-reviewed the the patient's records, history, medications, and allergies.  I have re-examined the patient.  I again discussed intraoperative plans and goals of post-operative recovery.  The patient agrees to proceed.  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   Anxiety 08/13/2018   Routine general medical examination at a health care facility 08/13/2017   Social anxiety disorder 03/26/2013   Obesity (BMI 30-39.9) 03/26/2013   Hyperlipidemia 01/23/2007   Essential hypertension 01/23/2007   Allergic rhinitis 01/23/2007    Past Medical History:  Diagnosis Date   Allergy    Anxiety    Arthritis    Complication of anesthesia    woke up early after anesthesia   Hyperlipidemia    Hypertension    Nephrolithiasis    Pneumonia 2013    Past Surgical History:  Procedure Laterality Date   COLONOSCOPY     HYDROCELE EXCISION     93-94   KNEE ARTHROSCOPY     left    Social History   Socioeconomic  History   Marital status: Married    Spouse name: Not on file   Number of children: Not on file   Years of education: Not on file   Highest education level: Bachelor's degree (e.g., BA, AB, BS)  Occupational History   Not on file  Tobacco Use   Smoking status: Former    Current packs/day: 0.00    Types: Cigarettes    Quit date: 07/23/2005    Years since quitting: 18.2   Smokeless tobacco: Former  Building services engineer status: Never Used  Substance and Sexual Activity   Alcohol use: Yes    Alcohol/week: 2.0 standard drinks of alcohol    Types: 1 Cans of beer, 1 Shots of liquor per week    Comment: maybe on a week   Drug use: Not Currently   Sexual activity: Yes    Partners: Female, Male  Other Topics Concern   Not on file  Social History Narrative   Not on file   Social Drivers of Health   Financial Resource Strain: Low Risk  (05/06/2023)   Overall Financial Resource Strain (CARDIA)    Difficulty of Paying Living Expenses: Not hard at all  Food Insecurity: No Food Insecurity (05/06/2023)   Hunger Vital Sign    Worried About Running Out of Food in the Last Year: Never true    Ran Out of Food in the Last Year: Never true  Transportation Needs: No Transportation Needs (05/06/2023)   PRAPARE - Administrator, Civil Service (Medical): No  Lack of Transportation (Non-Medical): No  Physical Activity: Insufficiently Active (05/06/2023)   Exercise Vital Sign    Days of Exercise per Week: 3 days    Minutes of Exercise per Session: 30 min  Stress: No Stress Concern Present (05/06/2023)   [employer] of Occupational Health - Occupational Stress Questionnaire    Feeling of Stress : Only a little  Social Connections: Moderately Integrated (05/06/2023)   Social Connection and Isolation Panel [NHANES]    Frequency of Communication with Friends and Family: Twice a week    Frequency of Social Gatherings with Friends and Family: Once a week    Attends Religious  Services: 1 to 4 times per year    Active Member of [employer] or Organizations: No    Attends Engineer, structural: Not on file    Marital Status: Married  Catering manager Violence: Not on file    Family History  Problem Relation Age of Onset   Kidney disease Father    Colon cancer Paternal Uncle    Cancer Paternal Grandmother        Lung   Hypertension Other    Colon polyps Neg Hx    Esophageal cancer Neg Hx    Rectal cancer Neg Hx    Stomach cancer Neg Hx     Medications Prior to Admission  Medication Sig Dispense Refill Last Dose/Taking   aspirin 81 MG EC tablet Take 81 mg by mouth daily.     Past Week   cetirizine (ZYRTEC) 10 MG tablet Take 10 mg by mouth daily.     10/29/2023   doxazosin (CARDURA) 8 MG tablet Take 1 tablet (8 mg total) by mouth at bedtime. 90 tablet 1 10/29/2023   EPINEPHrine 0.3 mg/0.3 mL IJ SOAJ injection Inject 0.3 mg into the muscle as needed for anaphylaxis. 1 each 1 Taking As Needed   fluticasone (FLONASE) 50 MCG/ACT nasal spray Place 1 spray into both nostrils daily. As needed for additional allergy relief. (Patient taking differently: Place 1 spray into both nostrils daily as needed for allergies.) 32 g 6 10/29/2023   ibuprofen (ADVIL) 200 MG tablet Take 400 mg by mouth every 6 (six) hours as needed for mild pain (pain score 1-3).   10/28/2023   lisinopril (ZESTRIL) 20 MG tablet Take 1 tablet (20 mg total) by mouth daily. 90 tablet 3 10/29/2023   sertraline (ZOLOFT) 50 MG tablet Take 1 tablet (50 mg total) by mouth daily. 90 tablet 3 10/29/2023   simvastatin (ZOCOR) 40 MG tablet Take 1 tablet (40 mg total) by mouth at bedtime. 90 tablet 3 10/29/2023   LORazepam (ATIVAN) 1 MG tablet One half to one tablet one hour prior to flying 30 tablet 1 More than a month    Current Facility-Administered Medications  Medication Dose Route Frequency Provider Last Rate Last Admin   bisacodyl (DULCOLAX) EC tablet 20 mg  20 mg Oral Once Karie Soda, MD       bupivacaine  liposome (EXPAREL) 1.3 % injection 266 mg  20 mL Infiltration Once Karie Soda, MD       cefoTEtan (CEFOTAN) 2 g in sodium chloride 0.9 % 100 mL IVPB  2 g Intravenous On Call to OR Karie Soda, MD       lactated ringers infusion   Intravenous Continuous Lannie Fields, DO 10 mL/hr at 10/30/23 1024 Continued from Pre-op at 10/30/23 1024     Allergies  Allergen Reactions   Duracillin As [Penicillin G Procaine]  GI upset   Other Rash    Allergy to wood/wood dust especially walnut.  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    BP (!) 144/92   Pulse (!) 110   Temp 98 F (36.7 C) (Oral)   Resp 16   Ht 5\' 9"  (1.753 m)   Wt (!) 154.2 kg   SpO2 95%   BMI 50.21 kg/m   Labs: Results for orders placed or performed during the hospital encounter of 10/30/23 (from the past 48 hours)  ABO/Rh     Status: None (Preliminary result)   Collection Time: 10/30/23  5:00 AM  Result Value Ref Range   ABO/RH(D) PENDING     Imaging / Studies: No results found.   Ardeth Sportsman, M.D., F.A.C.S. Gastrointestinal and Minimally Invasive Surgery [facility] [address], [address] [place], [place] [address] [phone] Main / Paging  10/30/2023 11:02 AM    Ardeth Sportsman

## 2023-10-30 NOTE — Op Note (Signed)
 10/30/2023  2:39 PM  PATIENT:  Jim Goodwin  48 y.o. male  Patient Care Team: Deeann Saint, MD as PCP - General (Family Medicine) Karie Soda, MD as Consulting Physician (General Surgery) Lynann Bologna, MD as Consulting Physician (Gastroenterology)  PRE-OPERATIVE DIAGNOSIS:  HEPATIC FLEXURE COLON POLYP UNRESECTABLE BY COLONOSCOPY  POST-OPERATIVE DIAGNOSIS:   ASCENDING  COLON POLYP UNRESECTABLE BY COLONOSCOPY INCARCERATED UMBILICAL HERNIA 2x2 cm  PROCEDURE:   ROBOTIC PROXIMAL "RIGHT" COLECTOMY WITH ANASTOMOSIS PRIMARY UMBILICAL HERNIA REPAIR TRANSVERSUS ABDOMINIS PLANE (TAP) BLOCK - BILATERAL  SURGEON:  Ardeth Sportsman, MD  ASSISTANT:  Angelena Form, MD  An experienced assistant was required given the standard of surgical care given the complexity of the case.  This assistant was needed for exposure, dissection, suction, tissue approximation, retraction, perception, etc  ANESTHESIA:  General endotracheal intubation anesthesia (GETA) and Regional TRANSVERSUS ABDOMINIS PLANE (TAP) nerve block -BILATERAL for perioperative & postoperative pain control at the level of the transverse abdominis & preperitoneal spaces along the flank at the anterior axillary line, from subcostal ridge to iliac crest under laparoscopic guidance provided with liposomal bupivacaine (Experel) 20mL mixed with 60 mL of bupivicaine 0.25% with epinephrine  Estimated Blood Loss (EBL):   Total I/O In: 1400 [I.V.:1300; IV Piggyback:100] Out: 220 [Urine:200; Blood:20].   (See anesthesia record)  Delay start of Pharmacological VTE agent (>24hrs) due to concerns of significant anemia, surgical blood loss, or risk of bleeding?:  no  DRAINS: (None)  SPECIMEN:  Colon - proximal right  DISPOSITION OF SPECIMEN:  Pathology  COUNTS:  Sponge, needle, & instrument counts CORRECT  PLAN OF CARE: Admit to inpatient   PATIENT DISPOSITION:  PACU - hemodynamically stable.  INDICATION:    Pleasant morbid obese male  history of polyps.  Followed by [facility] gastro[facility]ology.  Underwent colonoscopy.  Found to have mass in proximal colon rather large and not able to be safely removed endoscopically.  Best guess hepatic flexure.  Tattoo.  Surgical consultation requested I recommended segmental resection:  The anatomy & physiology of the digestive tract was discussed.  The pathophysiology was discussed.  Natural history risks without surgery was discussed.   I worked to give an overview of the disease and the frequent need to have multispecialty involvement.  I feel the risks of no intervention will lead to serious problems that outweigh the operative risks; therefore, I recommended a partial colectomy to remove the pathology.  Laparoscopic & open techniques were discussed.   Risks such as bleeding, infection, abscess, leak, reoperation, possible ostomy, hernia, heart attack, death, and other risks were discussed.  I noted a good likelihood this will help address the problem.   Goals of post-operative recovery were discussed as well.  We will work to minimize complications.  An educational handout on the pathology was given as well.  Questions were answered.    The patient expresses understanding & wishes to proceed with surgery.  OR FINDINGS:   Patient had tattooing along the mid ascending colon with a thickened mass several centimeters large primarily on the mesentery side.  No obvious lymphadenopathy.  No obvious metastatic disease on visceral parietal peritoneum or liver.  It is an isoperistaltic ileocolonic anastomosis that rests in the periumbilical region.  Umbilical hernia 2x2 cm incarcerated with omentum.  Reduction needed for surgery.  Therefore primary repair with #1 PDS done transversely  CASE DATA: Type of patient?: Elective [facility] Private Case Status of Case? Elective Scheduled Infection Present At Time Of Surgery (PATOS)?  NO  DESCRIPTION:   Informed consent was confirmed.  The patient underwent  general anaesthesia without difficulty.  The patient was positioned with arms tucked & secured appropriately.  VTE prevention in place.  The patient's abdomen was clipped, prepped, & draped in a sterile fashion.  Surgical timeout confirmed our plan.  The patient was positioned in reverse Trendelenburg.  Abdominal entry was gained using Varess technique at the left subcostal ridge on the anterior abdominal wall.  No elevated EtCO2 noted.  Port placed.  Camera inspection revealed no injury.  Extra ports were carefully placed under direct laparoscopic visualization.  We did laparoscopic expiration and saw dots of tattoo primarily in the right side.  Eventually able to rollover the ascending colon and could see an epicenter of gray tattooing at the mid descending colon.  No other suspicious areas in the colon.  Patient had greater omentum stuck in the umbilicus.  Had to reduce that for able to mobilize the greater omentum.  This produced a umbilical hernia.  Decided to proceed with proximal right colectomy.  We docked the [employer] Vinci robot carefully and placed instruments under visualization  I mobilized & reflected the greater omentum in the upper abdomen.  Position the small bowel into the left lower quadrant and pelvis.  I was able to elevate the proximal colon to isolate the ileocolonic pedicle.  I scored the ileal mesentery just proximal to that.   I carried that further dissection in a medial to lateral fashion.  I was able to bluntly get into the retro-mesenteric plane on the right side.  I freed the proximal right sided colonic mesentery off the retroperitoneum including the duodenal sweep, pancreatic head, & Gerota's fascia of the right kidney. I was able to get underneath the hepatic flexure.  I was able to get underneath the proximal and mid transverse colon.  I isolated the proximal ileocecal pedicle.  I skeletonized it & transected the vessels.    I then proceeded to mobilize the terminal ileum &  proximal "right" colon in a lateral to medial fashion.  I mobilized the distal ileal mesentery off its retroperitoneal and pelvic attachments.  Freed off the retroperitoneum in a inferior to superior fashion I mobilized the ascending colon off It is side wall attachments to the paracolic gutter and retroperitoneum.  I also mobilized the greater omentum off the mid transverse colon and mobilized the mid to proximal transverse colon in a superior to inferior fashion.  This allowed me to mobilize the hepatic flexure and get a complete mobilization of the proximal "right" colon to the mid-transverse colon.   I could isolate the pathology.   Chose an area in the distal ileum and transected the mesentery radially chose an area in the transverse colon just proximal to a dominant middle colic artery pedicle and transected that in a radial fashion to good location.  We confirmed good viability of the ileum and transverse colon plan for anastomosis. When he went ahead and proceeded with transection.  We transected at the distal ileum with a robotic stapler 60mm blue load.  We then transected transverse colon with a robotic stapler 60mm blue load.  We confirmed hemostasis.     I did a side-to-side stapled anastomosis of ileum to mid-transverse colon using a 60mm white load in an isoperistaltic fashion.  (Distal stump of ileum to mid transverse colon for the distal end of the anastomosis.  Proximal end of colon stump to more proximal ileum for the proximal end of the anastomosis).  I sewed the common staple channel wound with an absorbable suture (V-lock) in a running [place] fashion from each corner and meeting in the center.  We did meticulous inspection prove an airtight closure.  Ran a suture to close the ileal and transverse colon mesenteric defect with one of the V-Loc's.  Ran the ileum more proximally to make sure there is no twisting or trapping.  No mesenteric defects.  I protected the anastomosis line with an  anterior omentopexy of greater omentum using V lock suture.    We did reinspection of the abdomen.  Hemostasis was good.   Ureters, retroperitoneum, and bowel uninjured.  The anastomosis looked healthy.   Endoluminal gas was evacuated.  We placed the wound protector through the suprapubic 12mm port site after it was enlarged in a Pfannenstiel fashion.  Specimen removed without incident.   Ports & wound protector removed.  Hemostasis was good.  Sterile unused instruments were used from this point.  I primarily repaired the umbilical hernia using #1 PDS interrupted fashion through an curvilinear transverse infraumbilical incision to good result.  Confirm adequate closure from the Pfannenstiel incision side.  I closed the skin at the port sites using Monocryl stitch and sterile dressing.  I closed the extraction wound using a 0 Vicryl vertical peritoneal closure and a #1 PDS transverse anterior rectal fascial closure like a small Pfannenstiel closure. I closed the skin with some interrupted Monocryl stitches.  I placed sterile dressings.     Patient is being extubated go to recovery room. I discussed postop care with the patient in detail the office & in the holding area. Instructions are written. I discussed operative findings, updated the patient's status, discussed probable steps to recovery, and gave postoperative recommendations to the patient's spouse, Harbert Fitterer .  Recommendations were made.  Questions were answered.  She expressed understanding & appreciation.  Ardeth Sportsman, M.D., F.A.C.S. Gastrointestinal and Minimally Invasive Surgery [facility] [address] [place], [place] [address] [phone] Main / Paging   10/30/2023

## 2023-10-30 NOTE — Anesthesia Preprocedure Evaluation (Addendum)
 Anesthesia Evaluation  Patient identified by MRN, date of birth, ID band Patient awake    Reviewed: Allergy & Precautions, NPO status , Patient's Chart, lab work & pertinent test results  History of Anesthesia Complications (+) history of anesthetic complications  Airway Mallampati: III  TM Distance: >3 FB Neck ROM: Full    Dental  (+) Teeth Intact, Dental Advisory Given   Pulmonary former smoker   breath sounds clear to auscultation       Cardiovascular hypertension, Pt. on medications  Rhythm:Regular Rate:Normal     Neuro/Psych  PSYCHIATRIC DISORDERS Anxiety     negative neurological ROS     GI/Hepatic negative GI ROS, Neg liver ROS,,,  Endo/Other  negative endocrine ROS    Renal/GU Renal disease     Musculoskeletal  (+) Arthritis ,    Abdominal   Peds  Hematology negative hematology ROS (+)   Anesthesia Other Findings   Reproductive/Obstetrics                             Anesthesia Physical Anesthesia Plan  ASA: 3  Anesthesia Plan: General   Post-op Pain Management: Tylenol PO (pre-op)*, Celebrex PO (pre-op)* and Gabapentin PO (pre-op)*   Induction: Intravenous  PONV Risk Score and Plan: 3 and Ondansetron, Dexamethasone and Midazolam  Airway Management Planned: Oral ETT  Additional Equipment: None  Intra-op Plan:   Post-operative Plan: Extubation in OR  Informed Consent: I have reviewed the patients History and Physical, chart, labs and discussed the procedure including the risks, benefits and alternatives for the proposed anesthesia with the patient or authorized representative who has indicated his/her understanding and acceptance.     Dental advisory given  Plan Discussed with: CRNA  Anesthesia Plan Comments:        Anesthesia Quick Evaluation

## 2023-10-30 NOTE — Anesthesia Procedure Notes (Signed)
 Procedure Name: Intubation Date/Time: 10/30/2023 12:04 PM  Performed by: Lezlie Lye, CRNAPre-anesthesia Checklist: Patient identified, Emergency Drugs available, Suction available and Patient being monitored Patient Re-evaluated:Patient Re-evaluated prior to induction Oxygen Delivery Method: Circle system utilized Preoxygenation: Pre-oxygenation with 100% oxygen Induction Type: IV induction Ventilation: Two handed mask ventilation required and Oral airway inserted - appropriate to patient size Laryngoscope Size: Hyacinth Meeker and 3 Grade View: Grade I Tube type: Oral Tube size: 7.5 mm Number of attempts: 1 Airway Equipment and Method: Stylet Placement Confirmation: ETT inserted through vocal cords under direct vision, positive ETCO2 and breath sounds checked- equal and bilateral Secured at: 22 cm Tube secured with: Tape Dental Injury: Teeth and Oropharynx as per pre-operative assessment

## 2023-10-30 NOTE — Plan of Care (Signed)

## 2023-10-31 LAB — CBC
HCT: 39.1 % (ref 39.0–52.0)
Hemoglobin: 13.1 g/dL (ref 13.0–17.0)
MCH: 30.1 pg (ref 26.0–34.0)
MCHC: 33.5 g/dL (ref 30.0–36.0)
MCV: 89.9 fL (ref 80.0–100.0)
Platelets: 260 10*3/uL (ref 150–400)
RBC: 4.35 MIL/uL (ref 4.22–5.81)
RDW: 12.3 % (ref 11.5–15.5)
WBC: 11.4 10*3/uL — ABNORMAL HIGH (ref 4.0–10.5)
nRBC: 0 % (ref 0.0–0.2)

## 2023-10-31 LAB — BASIC METABOLIC PANEL WITH GFR
Anion gap: 8 (ref 5–15)
BUN: 11 mg/dL (ref 6–20)
CO2: 24 mmol/L (ref 22–32)
Calcium: 8.6 mg/dL — ABNORMAL LOW (ref 8.9–10.3)
Chloride: 105 mmol/L (ref 98–111)
Creatinine, Ser: 0.89 mg/dL (ref 0.61–1.24)
GFR, Estimated: >60 mL/min (ref >60)
Glucose, Bld: 127 mg/dL — ABNORMAL HIGH (ref 70–99)
Potassium: 4.2 mmol/L (ref 3.5–5.1)
Sodium: 137 mmol/L (ref 135–145)

## 2023-10-31 LAB — GLUCOSE, CAPILLARY: Glucose-Capillary: 110 mg/dL — ABNORMAL HIGH (ref 70–99)

## 2023-10-31 LAB — MAGNESIUM: Magnesium: 2.1 mg/dL (ref 1.7–2.4)

## 2023-10-31 NOTE — Plan of Care (Signed)
  Problem: Activity: Goal: Ability to tolerate increased activity will improve Outcome: Progressing   Problem: Activity: Goal: Risk for activity intolerance will decrease Outcome: Progressing   Problem: Nutrition: Goal: Adequate nutrition will be maintained Outcome: Progressing

## 2023-10-31 NOTE — Progress Notes (Signed)
   10/31/23 1116  TOC Brief Assessment  Insurance and Status Reviewed  Patient has primary care physician Yes  Home environment has been reviewed home with spouse  Prior level of function: independent  Prior/Current Home Services No current home services  Social Drivers of Health Review SDOH reviewed no interventions necessary  Readmission risk has been reviewed Yes  Transition of care needs no transition of care needs at this time

## 2023-10-31 NOTE — Progress Notes (Signed)
 10/31/2023  Jim Goodwin [id number] Jan 19, 1976  CARE TEAM: PCP: Deeann Saint, MD  Outpatient Care Team: Patient Care Team: Deeann Saint, MD as PCP - General (Family Medicine) Karie Soda, MD as Consulting Physician (General Surgery) Lynann Bologna, MD as Consulting Physician (Gastroenterology)  Inpatient Treatment Team: Treatment Team:  Karie Soda, MD Idelia Salm, RN Amil Amen, RN   Problem List:   Principal Problem:   Colonic mass   10/30/2023  POST-OPERATIVE DIAGNOSIS:   ASCENDING  COLON POLYP UNRESECTABLE BY COLONOSCOPY INCARCERATED UMBILICAL HERNIA 2x2 cm   PROCEDURE:   ROBOTIC PROXIMAL "RIGHT" COLECTOMY WITH ANASTOMOSIS PRIMARY UMBILICAL HERNIA REPAIR TRANSVERSUS ABDOMINIS PLANE (TAP) BLOCK - BILATERAL   SURGEON:  Ardeth Sportsman, MD  OR FINDINGS:   Patient had tattooing along the mid ascending colon with a thickened mass several centimeters large primarily on the mesentery side.  No obvious lymphadenopathy.  No obvious metastatic disease on visceral parietal peritoneum or liver.   It is an isoperistaltic ileocolonic anastomosis that rests in the periumbilical region.   Umbilical hernia 2x2 cm incarcerated with omentum.  Reduction needed for surgery.  Therefore primary repair with #1 PDS done transversely   CASE DATA: Type of patient?: Elective [facility] Private Case Status of Case? Elective Scheduled Infection Present At Time Of Surgery (PATOS)?  NO  Assessment [facility] Stay = 1 days) 1 Day Post-Op    Recovering rather well so far    Plan:  -Follow-up in pathology.  ERAS protocol.  Gradually advance diet.  Foley catheter out postop day 1 = this morning per protocol.  Multimodal pain control.  Soluble fiber regimen.  Hypertension control.  Lisinopril and doxazosin.  Breakthrough in case.  -monitor electrolytes & replace as needed  Keep K>4, Mg>2, Phos>3  -VTE prophylaxis- SCDs.  Anticoagulation prophyllaxis SQ as  appropriate  -mobilize as tolerated to help recovery.  Enlist therapies in moderate/high risk patients as appropriate  I updated the patient's status to the patient  Recommendations were made.  Questions were answered.  He expressed understanding & appreciation.  -Disposition:  Disposition:  The patient is from: Home Anticipate discharge to:  Home Anticipated Date of Discharge is:  April 11,2025   Barriers to discharge:  Pending Clinical improvement (more likely than not)  Patient currently is NOT MEDICALLY STABLE for discharge from the hospital from a surgery standpoint.      I reviewed last 24 h vitals and pain scores, last 48 h intake and output, last 24 h labs and trends, and last 24 h imaging results.  I have reviewed this patient's available data, including medical history, events of note, test results, etc as part of my evaluation.   A significant portion of that time was spent in counseling. Care during the described time interval was provided by me.  This care required straight-forward level of medical decision making.  10/31/2023    Subjective: (Chief complaint)  No major events.  Walked in hallways.  Tolerating liquids without nausea.  Pain controlled.  Objective:  Vital signs:  Vitals:   10/30/23 2039 10/31/23 0128 10/31/23 0500 10/31/23 0552  BP: 113/69 118/72  116/74  Pulse: 92 84  76  Resp: 16 16  15   Temp: 98.5 F (36.9 C) 98.3 F (36.8 C)  98.1 F (36.7 C)  TempSrc: Oral Oral  Oral  SpO2: 95% 94%  96%  Weight:   (!) 155.1 kg   Height:        Last BM Date : 10/30/23 (  before surgery)  Intake/Output   Yesterday:  04/09 0701 - 04/10 0700 In: 3026.1 [P.O.:1130; I.V.:1696.1; IV Piggyback:200] Out: 2320 [Urine:2300; Blood:20] This shift:  No intake/output data recorded.  Bowel function:  Flatus: YES  BM:  No  Drain: (No drain)   Physical Exam:  General: Pt awake/alert in no acute distress Eyes: PERRL, normal EOM.  Sclera clear.   No icterus Neuro: CN II-XII intact w/o focal sensory/motor deficits. Lymph: No head/neck/groin lymphadenopathy Psych:  No delerium/psychosis/paranoia.  Oriented x 4 HENT: Normocephalic, Mucus membranes moist.  No thrush Neck: Supple, No tracheal deviation.  No obvious thyromegaly Chest: No pain to chest wall compression.  Good respiratory excursion.  No audible wheezing CV:  Pulses intact.  Regular rhythm.  No major extremity edema MS: Normal AROM mjr joints.  No obvious deformity  Abdomen: Soft.  Nondistended.  Mildly tender at incisions only.  Dressings clean dry and intact.  No evidence of peritonitis.  No incarcerated hernias.  Ext:   No deformity.  No mjr edema.  No cyanosis Skin: No petechiae / purpurea.  No major sores.  Warm and dry    Results:   Cultures: No results found for this or any previous visit (from the past 720 hours).  Labs: Results for orders placed or performed during the hospital encounter of 10/30/23 (from the past 48 hours)  ABO/Rh     Status: None   Collection Time: 10/30/23  5:00 AM  Result Value Ref Range   ABO/RH(D)      A POS Performed at [facility], [address] [place], [place] [address]   Basic metabolic panel     Status: Abnormal   Collection Time: 10/31/23  4:17 AM  Result Value Ref Range   Sodium 137 135 - 145 mmol/L   Potassium 4.2 3.5 - 5.1 mmol/L   Chloride 105 98 - 111 mmol/L   CO2 24 22 - 32 mmol/L   Glucose, Bld 127 (H) 70 - 99 mg/dL    Comment: Glucose reference range applies only to samples taken after fasting for at least 8 hours.   BUN 11 6 - 20 mg/dL   Creatinine, Ser 4.09 0.61 - 1.24 mg/dL   Calcium 8.6 (L) 8.9 - 10.3 mg/dL   GFR, Estimated >81 >19 mL/min    Comment: (NOTE) Calculated using the CKD-EPI Creatinine Equation (2021)    Anion gap 8 5 - 15    Comment: Performed at [facility], [address], [place], [place] [address]  CBC     Status: Abnormal   Collection Time:  10/31/23  4:17 AM  Result Value Ref Range   WBC 11.4 (H) 4.0 - 10.5 K/uL   RBC 4.35 4.22 - 5.81 MIL/uL   Hemoglobin 13.1 13.0 - 17.0 g/dL   HCT 95.6 21.3 - 08.6 %   MCV 89.9 80.0 - 100.0 fL   MCH 30.1 26.0 - 34.0 pg   MCHC 33.5 30.0 - 36.0 g/dL   RDW 57.8 46.9 - 62.9 %   Platelets 260 150 - 400 K/uL   nRBC 0.0 0.0 - 0.2 %    Comment: Performed at [facility], [address], [place], [place] [address]  Magnesium     Status: None   Collection Time: 10/31/23  4:17 AM  Result Value Ref Range   Magnesium 2.1 1.7 - 2.4 mg/dL    Comment: Performed at [facility], [address] [place], [place] [address]  Imaging / Studies: No results found.  Medications / Allergies: per chart  Antibiotics: Anti-infectives (From admission, onward)    Start     Dose/Rate Route Frequency Ordered Stop   10/30/23 2359  cefoTEtan (CEFOTAN) 2 g in sodium chloride 0.9 % 100 mL IVPB        2 g 200 mL/hr over 30 Minutes Intravenous Every 12 hours 10/30/23 1501 10/31/23 0042   10/30/23 1400  neomycin (MYCIFRADIN) tablet 1,000 mg  Status:  Discontinued       Placed in "And" Linked Group   1,000 mg Oral 3 times per day 10/30/23 0915 10/30/23 0917   10/30/23 1400  metroNIDAZOLE (FLAGYL) tablet 1,000 mg  Status:  Discontinued       Placed in "And" Linked Group   1,000 mg Oral 3 times per day 10/30/23 0915 10/30/23 0917   10/30/23 0930  cefoTEtan (CEFOTAN) 2 g in sodium chloride 0.9 % 100 mL IVPB        2 g 200 mL/hr over 30 Minutes Intravenous On call to O.R. 10/30/23 0915 10/30/23 1232         Note: Portions of this report may have been transcribed using voice recognition software. Every effort was made to ensure accuracy; however, inadvertent computerized transcription errors may be present.   Any transcriptional errors that result from this process are unintentional.    Ardeth Sportsman, MD, FACS, MASCRS Esophageal, Gastrointestinal & Colorectal  Surgery Robotic and Minimally Invasive Surgery  [facility] [address] [place], [place] [address] [phone] Fax [phone] Main  CONTACT INFORMATION: Weekday (9AM-5PM): Call [facility] main office at [phone] Weeknight (5PM-9AM) or Weekend/Holiday: Check EPIC "Web Links" tab & use "AMION" (password " [email or web address]") for General Surgery [facility] coverage  Please, DO NOT use SecureChat  (it is not reliable communication to reach operating surgeons & will lead to a delay in care).   Epic staff messaging available for outptient concerns needing 1-2 business day response.      10/31/2023  7:19 AM

## 2023-10-31 NOTE — Plan of Care (Signed)
  Problem: Education: Goal: Understanding of discharge needs will improve Outcome: Progressing Goal: Verbalization of understanding of the causes of altered bowel function will improve Outcome: Progressing   Problem: Activity: Goal: Ability to tolerate increased activity will improve Outcome: Progressing

## 2023-11-01 MED ORDER — METHOCARBAMOL 500 MG PO TABS: 500.0000 mg | ORAL_TABLET | Freq: Three times a day (TID) | ORAL | 1 refills | Status: DC | PRN

## 2023-11-01 MED ORDER — TRAMADOL HCL 50 MG PO TABS: 50.0000 mg | ORAL_TABLET | Freq: Four times a day (QID) | ORAL | 0 refills | Status: AC | PRN

## 2023-11-01 NOTE — Progress Notes (Signed)
 Patient discharged home, IV removed, discharge paperwork provided and explained, patient verbalized understanding.

## 2023-11-01 NOTE — Discharge Summary (Signed)
 Physician Discharge Summary    Jim Goodwin MRN: [id number] DOB/AGE: Jan 29, 1976 = 47 y.o.  Patient Care Team: Deeann Saint, MD as PCP - General (Family Medicine) Karie Soda, MD as Consulting Physician (General Surgery) Lynann Bologna, MD as Consulting Physician (Gastroenterology)  Admit date: 10/30/2023  Discharge date: 11/01/2023  Hospital Stay = 2 days    Discharge Diagnoses:  Principal Problem:   Colonic mass   2 Days Post-Op  10/30/2023  POST-OPERATIVE DIAGNOSIS:   ASCENDING  COLON POLYP UNRESECTABLE BY COLONOSCOPY INCARCERATED UMBILICAL HERNIA 2x2 cm   PROCEDURE:   ROBOTIC PROXIMAL "RIGHT" COLECTOMY WITH ANASTOMOSIS PRIMARY UMBILICAL HERNIA REPAIR TRANSVERSUS ABDOMINIS PLANE (TAP) BLOCK - BILATERAL   SURGEON:  Ardeth Sportsman, MD   OR FINDINGS:   Patient had tattooing along the mid ascending colon with a thickened mass several centimeters large primarily on the mesentery side.  No obvious lymphadenopathy.  No obvious metastatic disease on visceral parietal peritoneum or liver.   It is an isoperistaltic ileocolonic anastomosis that rests in the periumbilical region.   Umbilical hernia 2x2 cm incarcerated with omentum.  Reduction needed for surgery.  Therefore primary repair with #1 PDS done transversely   CASE DATA: Type of patient?: Elective [facility] Private Case Status of Case? Elective Scheduled Infection Present At Time Of Surgery (PATOS)?  NO    Consults: Case Management / Social Work and Anesthesia  Hospital Course:   The patient underwent the surgery above.  Postoperatively, the patient gradually mobilized and advanced to a solid diet.  Pain and other symptoms were treated aggressively.    By the time of discharge, the patient was walking well the hallways, eating food, having flatus.  Pain was well-controlled on an oral medications.  Based on meeting discharge criteria and continuing to recover, I felt it was safe for the patient to be discharged  from the hospital to further recover with close followup. Postoperative recommendations were discussed in detail.  They are written as well.  Discharged Condition: good  Discharge Exam: Blood pressure 132/82, pulse 87, temperature 97.9 F (36.6 C), temperature source Oral, resp. rate 20, height 5\' 9"  (1.753 m), weight (!) 155.3 kg, SpO2 97%.  General: Pt awake/alert/oriented x4 in No acute distress Eyes: PERRL, normal EOM.  Sclera clear.  No icterus Neuro: CN II-XII intact w/o focal sensory/motor deficits. Lymph: No head/neck/groin lymphadenopathy Psych:  No delerium/psychosis/paranoia HENT: Normocephalic, Mucus membranes moist.  No thrush Neck: Supple, No tracheal deviation Chest:  No chest wall pain w good excursion CV:  Pulses intact.  Regular rhythm MS: Normal AROM mjr joints.  No obvious deformity Abdomen: Soft.  Nondistended.  Mildly tender at incisions only.  No evidence of peritonitis.  No incarcerated hernias. Ext:  SCDs BLE.  No mjr edema.  No cyanosis Skin: No petechiae / purpura   Disposition:         Allergies as of 11/01/2023       Reactions   Duracillin As [penicillin G Procaine]    GI upset   Other Rash   Allergy to wood/wood dust especially walnut.  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        Medication List     TAKE these medications    aspirin EC 81 MG tablet Take 81 mg by mouth daily.   cetirizine 10 MG tablet Commonly known as: ZYRTEC Take 10 mg by mouth daily.   doxazosin 8 MG tablet Commonly known as: CARDURA Take 1 tablet (8 mg total)  by mouth at bedtime.   EPINEPHrine 0.3 mg/0.3 mL Soaj injection Commonly known as: EPI-PEN Inject 0.3 mg into the muscle as needed for anaphylaxis.   fluticasone 50 MCG/ACT nasal spray Commonly known as: FLONASE Place 1 spray into both nostrils daily. As needed for additional allergy relief. What changed:  when to take this reasons to take this additional instructions   ibuprofen 200  MG tablet Commonly known as: ADVIL Take 400 mg by mouth every 6 (six) hours as needed for mild pain (pain score 1-3).   lisinopril 20 MG tablet Commonly known as: ZESTRIL Take 1 tablet (20 mg total) by mouth daily.   LORazepam 1 MG tablet Commonly known as: ATIVAN One half to one tablet one hour prior to flying   methocarbamol 500 MG tablet Commonly known as: ROBAXIN Take 1 tablet (500 mg total) by mouth every 8 (eight) hours as needed for muscle spasms.   sertraline 50 MG tablet Commonly known as: ZOLOFT Take 1 tablet (50 mg total) by mouth daily.   simvastatin 40 MG tablet Commonly known as: ZOCOR Take 1 tablet (40 mg total) by mouth at bedtime.   traMADol 50 MG tablet Commonly known as: ULTRAM Take 1-2 tablets (50-100 mg total) by mouth every 6 (six) hours as needed for moderate pain (pain score 4-6) or severe pain (pain score 7-10).        Significant Diagnostic Studies:  Results for orders placed or performed during the hospital encounter of 10/30/23 (from the past 72 hours)  ABO/Rh     Status: None   Collection Time: 10/30/23  5:00 AM  Result Value Ref Range   ABO/RH(D)      A POS Performed at [facility], [address], [place], [place] [address]   Basic metabolic panel     Status: Abnormal   Collection Time: 10/31/23  4:17 AM  Result Value Ref Range   Sodium 137 135 - 145 mmol/L   Potassium 4.2 3.5 - 5.1 mmol/L   Chloride 105 98 - 111 mmol/L   CO2 24 22 - 32 mmol/L   Glucose, Bld 127 (H) 70 - 99 mg/dL    Comment: Glucose reference range applies only to samples taken after fasting for at least 8 hours.   BUN 11 6 - 20 mg/dL   Creatinine, Ser 6.04 0.61 - 1.24 mg/dL   Calcium 8.6 (L) 8.9 - 10.3 mg/dL   GFR, Estimated >54 >09 mL/min    Comment: (NOTE) Calculated using the CKD-EPI Creatinine Equation (2021)    Anion gap 8 5 - 15    Comment: Performed at [facility], [address] [place], [place] [address]  CBC      Status: Abnormal   Collection Time: 10/31/23  4:17 AM  Result Value Ref Range   WBC 11.4 (H) 4.0 - 10.5 K/uL   RBC 4.35 4.22 - 5.81 MIL/uL   Hemoglobin 13.1 13.0 - 17.0 g/dL   HCT 47.8 29.5 - 62.1 %   MCV 89.9 80.0 - 100.0 fL   MCH 30.1 26.0 - 34.0 pg   MCHC 33.5 30.0 - 36.0 g/dL   RDW 30.8 65.7 - 84.6 %   Platelets 260 150 - 400 K/uL   nRBC 0.0 0.0 - 0.2 %    Comment: Performed at [facility], [address] [place], [place] [address]  Magnesium     Status: None   Collection Time: 10/31/23  4:17 AM  Result Value Ref  Range   Magnesium 2.1 1.7 - 2.4 mg/dL    Comment: Performed at [facility], [address], [place], [place] [address]  Glucose, capillary     Status: Abnormal   Collection Time: 10/31/23  7:32 AM  Result Value Ref Range   Glucose-Capillary 110 (H) 70 - 99 mg/dL    Comment: Glucose reference range applies only to samples taken after fasting for at least 8 hours.    No results found.  Past Medical History:  Diagnosis Date   Allergy    Anxiety    Arthritis    Complication of anesthesia    woke up early after anesthesia   Hyperlipidemia    Hypertension    Nephrolithiasis    Pneumonia 2013    Past Surgical History:  Procedure Laterality Date   COLONOSCOPY     HYDROCELE EXCISION     93-94   KNEE ARTHROSCOPY     left    Social History   Socioeconomic History   Marital status: Married    Spouse name: Not on file   Number of children: Not on file   Years of education: Not on file   Highest education level: Bachelor's degree (e.g., BA, AB, BS)  Occupational History   Not on file  Tobacco Use   Smoking status: Former    Current packs/day: 0.00    Types: Cigarettes    Quit date: 07/23/2005    Years since quitting: 18.2   Smokeless tobacco: Former  Building services engineer status: Never Used  Substance and Sexual Activity   Alcohol use: Yes    Alcohol/week: 2.0 standard drinks of alcohol    Types: 1 Cans of  beer, 1 Shots of liquor per week    Comment: maybe on a week   Drug use: Not Currently   Sexual activity: Yes    Partners: Female, Male  Other Topics Concern   Not on file  Social History Narrative   Not on file   Social Drivers of Health   Financial Resource Strain: Low Risk  (05/06/2023)   Overall Financial Resource Strain (CARDIA)    Difficulty of Paying Living Expenses: Not hard at all  Food Insecurity: No Food Insecurity (10/30/2023)   Hunger Vital Sign    Worried About Running Out of Food in the Last Year: Never true    Ran Out of Food in the Last Year: Never true  Transportation Needs: No Transportation Needs (10/30/2023)   PRAPARE - Administrator, Civil Service (Medical): No    Lack of Transportation (Non-Medical): No  Physical Activity: Insufficiently Active (05/06/2023)   Exercise Vital Sign    Days of Exercise per Week: 3 days    Minutes of Exercise per Session: 30 min  Stress: No Stress Concern Present (05/06/2023)   [employer] of Occupational Health - Occupational Stress Questionnaire    Feeling of Stress : Only a little  Social Connections: Moderately Integrated (05/06/2023)   Social Connection and Isolation Panel [NHANES]    Frequency of Communication with Friends and Family: Twice a week    Frequency of Social Gatherings with Friends and Family: Once a week    Attends Religious Services: 1 to 4 times per year    Active Member of [employer] or Organizations: No    Attends Banker Meetings: Not on file    Marital Status: Married  Intimate Partner Violence: Patient Declined (10/30/2023)   Humiliation, Afraid, Rape, and Kick questionnaire  Fear of Current or Ex-Partner: Patient declined    Emotionally Abused: Patient declined    Physically Abused: Patient declined    Sexually Abused: Patient declined    Family History  Problem Relation Age of Onset   Kidney disease Father    Colon cancer Paternal Uncle    Cancer Paternal Grandmother         Lung   Hypertension Other    Colon polyps Neg Hx    Esophageal cancer Neg Hx    Rectal cancer Neg Hx    Stomach cancer Neg Hx     Current Facility-Administered Medications  Medication Dose Route Frequency Provider Last Rate Last Admin   0.9 %  sodium chloride infusion  250 mL Intravenous PRN Karie Soda, MD       acetaminophen (TYLENOL) tablet 1,000 mg  1,000 mg Oral Q6H Karie Soda, MD   1,000 mg at 11/01/23 0531   alum & mag hydroxide-simeth (MAALOX/MYLANTA) 200-200-20 MG/5ML suspension 30 mL  30 mL Oral Q6H PRN Karie Soda, MD       alvimopan (ENTEREG) capsule 12 mg  12 mg Oral BID Karie Soda, MD       aspirin EC tablet 81 mg  81 mg Oral Daily Karie Soda, MD   81 mg at 10/31/23 1024   diphenhydrAMINE (BENADRYL) 12.5 MG/5ML elixir 12.5 mg  12.5 mg Oral Q6H PRN Karie Soda, MD       Or   diphenhydrAMINE (BENADRYL) injection 12.5 mg  12.5 mg Intravenous Q6H PRN Karie Soda, MD       doxazosin (CARDURA) tablet 8 mg  8 mg Oral Laurena Slimmer, MD   8 mg at 10/31/23 2005   enoxaparin (LOVENOX) injection 40 mg  40 mg Subcutaneous Q24H Karie Soda, MD   40 mg at 10/31/23 4782   EPINEPHrine (EPI-PEN) injection 0.3 mg  0.3 mg Intramuscular PRN Karie Soda, MD       feeding supplement (ENSURE SURGERY) liquid 237 mL  237 mL Oral BID BM Karie Soda, MD   237 mL at 10/31/23 1405   fluticasone (FLONASE) 50 MCG/ACT nasal spray 1 spray  1 spray Each Nare Daily Karie Soda, MD       gabapentin (NEURONTIN) capsule 200 mg  200 mg Oral Laurena Slimmer, MD   200 mg at 10/31/23 2006   hydrALAZINE (APRESOLINE) injection 10 mg  10 mg Intravenous Q2H PRN Karie Soda, MD       HYDROmorphone (DILAUDID) injection 0.5-2 mg  0.5-2 mg Intravenous Q4H PRN Karie Soda, MD       lactated ringers infusion   Intravenous Q8H PRN Elani Delph, Viviann Spare, MD       lisinopril (ZESTRIL) tablet 20 mg  20 mg Oral Daily Karie Soda, MD   20 mg at 10/31/23 1024   loratadine (CLARITIN) tablet 10 mg   10 mg Oral Daily Karie Soda, MD   10 mg at 10/31/23 1024   magic mouthwash  15 mL Oral QID PRN Karie Soda, MD       melatonin tablet 3 mg  3 mg Oral QHS PRN Karie Soda, MD       menthol-cetylpyridinium (CEPACOL) lozenge 3 mg  1 lozenge Oral PRN Karie Soda, MD       methocarbamol (ROBAXIN) injection 1,000 mg  1,000 mg Intravenous Q6H PRN Karie Soda, MD       methocarbamol (ROBAXIN) tablet 1,000 mg  1,000 mg Oral Q6H PRN Karie Soda, MD  metoprolol tartrate (LOPRESSOR) injection 5 mg  5 mg Intravenous Q6H PRN Karie Soda, MD       naphazoline-glycerin (CLEAR EYES REDNESS) ophth solution 1-2 drop  1-2 drop Both Eyes QID PRN Karie Soda, MD       ondansetron [facility]) tablet 4 mg  4 mg Oral Q6H PRN Karie Soda, MD       Or   ondansetron [facility]) injection 4 mg  4 mg Intravenous Q6H PRN Karie Soda, MD       phenol (CHLORASEPTIC) mouth spray 2 spray  2 spray Mouth/Throat PRN Karie Soda, MD       polycarbophil (FIBERCON) tablet 625 mg  625 mg Oral BID Karie Soda, MD   625 mg at 10/31/23 2006   prochlorperazine (COMPAZINE) tablet 10 mg  10 mg Oral Q6H PRN Karie Soda, MD       Or   prochlorperazine (COMPAZINE) injection 5-10 mg  5-10 mg Intravenous Q6H PRN Karie Soda, MD       sertraline (ZOLOFT) tablet 50 mg  50 mg Oral Daily Karie Soda, MD   50 mg at 10/31/23 1024   simethicone (MYLICON) chewable tablet 40 mg  40 mg Oral Q6H PRN Karie Soda, MD       simvastatin (ZOCOR) tablet 40 mg  40 mg Oral Laurena Slimmer, MD   40 mg at 10/31/23 2005   sodium chloride (OCEAN) 0.65 % nasal spray 1-2 spray  1-2 spray Each Nare Q6H PRN Karie Soda, MD       sodium chloride flush (NS) 0.9 % injection 3 mL  3 mL Intravenous Catha Gosselin, MD   3 mL at 10/31/23 2010   sodium chloride flush (NS) 0.9 % injection 3 mL  3 mL Intravenous PRN Karie Soda, MD       traMADol Janean Sark) tablet 50-100 mg  50-100 mg Oral Q6H PRN Karie Soda, MD   50 mg at 10/31/23 2006      Allergies  Allergen Reactions   Duracillin As [Penicillin G Procaine]     GI upset   Other Rash    Allergy to wood/wood dust especially walnut.  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    Signed:   Ardeth Sportsman, MD, FACS, MASCRS Esophageal, Gastrointestinal & Colorectal Surgery Robotic and Minimally Invasive Surgery  [facility] [address] [place], [place] [address] [phone] Fax [phone] Main  CONTACT INFORMATION: Weekday (9AM-5PM): Call [facility] main office at [phone] Weeknight (5PM-9AM) or Weekend/Holiday: Check EPIC "Web Links" tab & use "AMION" (password " [email or web address]") for General Surgery [facility] coverage  Please, DO NOT use SecureChat  (it is not reliable communication to reach operating surgeons & will lead to a delay in care).   Epic staff messaging available for outptient concerns needing 1-2 business day response.      11/01/2023, 7:45 AM

## 2023-11-04 ENCOUNTER — Other Ambulatory Visit: Payer: Self-pay | Admitting: Family Medicine

## 2023-11-04 DIAGNOSIS — I10 Essential (primary) hypertension: Secondary | ICD-10-CM

## 2023-11-04 LAB — SURGICAL PATHOLOGY

## 2023-11-28 ENCOUNTER — Ambulatory Visit: Admitting: Family Medicine

## 2023-11-28 VITALS — BP 122/78 | HR 101 | Temp 98.2°F | Wt 335.8 lb

## 2023-11-28 DIAGNOSIS — R051 Acute cough: Secondary | ICD-10-CM | POA: Diagnosis not present

## 2023-11-28 DIAGNOSIS — J0110 Acute frontal sinusitis, unspecified: Secondary | ICD-10-CM | POA: Diagnosis not present

## 2023-11-28 MED ORDER — AZITHROMYCIN 250 MG PO TABS: ORAL_TABLET | ORAL | 0 refills | Status: AC

## 2023-11-28 MED ORDER — BENZONATATE 200 MG PO CAPS: 200.0000 mg | ORAL_CAPSULE | Freq: Two times a day (BID) | ORAL | 0 refills | Status: DC | PRN

## 2023-11-28 NOTE — Progress Notes (Signed)
 Established Patient Office Visit   Subjective  Patient ID: Jim Goodwin, male    DOB: [birth year]  Age: 48 y.o. MRN: [id number]  Chief Complaint  Patient presents with   Cough    Cough and congestion, some face pain, started 6 days ago, mucus is yellow/green    Patient is a 48 year old male seen for acute concern.  Patient endorses cough, congestion, facial pain x 6 days.  Symptoms started with sore throat but have progressed.  Patient tried Flonase , Zyrtec, Tylenol  and ibuprofen for symptoms.  Patient states he is scheduled to go back to work on Monday status post right colectomy for mass noted on colonoscopy.  Also had hernia repair.    Patient Active Problem List   Diagnosis Date Noted   Colonic mass 10/30/2023   Allergy to wood dust 04/03/2021   Anxiety 08/13/2018   Routine general medical examination at a health care facility 08/13/2017   Social anxiety disorder 03/26/2013   Obesity (BMI 30-39.9) 03/26/2013   Hyperlipidemia 01/23/2007   Essential hypertension 01/23/2007   Allergic rhinitis 01/23/2007   Past Medical History:  Diagnosis Date   Allergy    Anxiety    Arthritis    Complication of anesthesia    woke up early after anesthesia   Hyperlipidemia    Hypertension    Nephrolithiasis    Pneumonia 2013   Past Surgical History:  Procedure Laterality Date   COLONOSCOPY     HYDROCELE EXCISION     93-94   KNEE ARTHROSCOPY     left   Social History   Tobacco Use   Smoking status: Former    Current packs/day: 0.00    Types: Cigarettes    Quit date: 07/23/2005    Years since quitting: 18.3   Smokeless tobacco: Former  Building services engineer status: Never Used  Substance Use Topics   Alcohol use: Yes    Alcohol/week: 2.0 standard drinks of alcohol    Types: 1 Cans of beer, 1 Shots of liquor per week    Comment: maybe on a week   Drug use: Not Currently   Family History  Problem Relation Age of Onset   Kidney disease Father    Colon cancer Paternal  Uncle    Cancer Paternal Grandmother        Lung   Hypertension Other    Colon polyps Neg Hx    Esophageal cancer Neg Hx    Rectal cancer Neg Hx    Stomach cancer Neg Hx    Allergies  Allergen Reactions   Duracillin As [Penicillin G Procaine]     GI upset   Other Rash    Allergy to wood/wood dust especially walnut.  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      ROS Negative unless stated above    Objective:      BP 122/78 (BP Location: Left Arm, Patient Position: Sitting, Cuff Size: Large)   Pulse (!) 101   Temp 98.2 F (36.8 C) (Oral)   Wt (!) 335 lb 12.8 oz (152.3 kg)   SpO2 98%   BMI 49.59 kg/m  BP Readings from Last 3 Encounters:  11/28/23 122/78  11/01/23 128/72  10/18/23 133/88   Wt Readings from Last 3 Encounters:  11/28/23 (!) 335 lb 12.8 oz (152.3 kg)  11/01/23 (!) 342 lb 4.9 oz (155.3 kg)  10/18/23 (!) 340 lb (154.2 kg)      Physical Exam Constitutional:  General: He is not in acute distress.    Appearance: Normal appearance.  HENT:     Head: Normocephalic and atraumatic.     Ears:     Comments: Bilateral TMs full.    Nose:     Right Sinus: Frontal sinus tenderness present.     Left Sinus: Frontal sinus tenderness present.     Mouth/Throat:     Mouth: Mucous membranes are moist.  Cardiovascular:     Rate and Rhythm: Normal rate and regular rhythm.     Heart sounds: Normal heart sounds. No murmur heard.    No gallop.  Pulmonary:     Effort: Pulmonary effort is normal. No respiratory distress.     Breath sounds: Normal breath sounds. No wheezing, rhonchi or rales.     Comments: Dry cough Skin:    General: Skin is warm and dry.  Neurological:     Mental Status: He is alert and oriented to person, place, and time.        05/08/2023   11:14 AM 05/08/2023   10:12 AM 03/14/2022    3:51 PM  Depression screen PHQ 2/9  Decreased Interest 0 0 0  Down, Depressed, Hopeless 1 1 0  PHQ - 2 Score 1 1 0  Altered sleeping 0 0 0   Tired, decreased energy 0 1 1  Change in appetite 0 0 1  Feeling bad or failure about yourself  1 1 0  Trouble concentrating 1 1 1   Moving slowly or fidgety/restless 0 0 0  Suicidal thoughts 0 0 0  PHQ-9 Score 3 4 3   Difficult doing work/chores Not difficult at all Not difficult at all Not difficult at all      05/08/2023   10:12 AM 01/29/2020    8:40 AM 08/13/2018    9:09 AM  GAD 7 : Generalized Anxiety Score  Nervous, Anxious, on Edge 1 1 2   Control/stop worrying 1 1 2   Worry too much - different things 1 1 1   Trouble relaxing 0 1 1  Restless 1 0 0  Easily annoyed or irritable 0 1 0  Afraid - awful might happen 1 1 1   Total GAD 7 Score 5 6 7   Anxiety Difficulty Not difficult at all Not difficult at all Not difficult at all     No results found for any visits on 11/28/23.    Assessment & Plan:  Acute frontal sinusitis, recurrence not specified -     Azithromycin ; Take 2 tablets on day 1, then 1 tablet daily on days 2 through 5  Dispense: 6 tablet; Refill: 0  Acute cough -     Benzonatate ; Take 1 capsule (200 mg total) by mouth 2 (two) times daily as needed for cough.  Dispense: 20 capsule; Refill: 0  Start ABX for sinusitis.  Tessalon  for cough.  Continue OTC cough/cold medications, antihistamine, Tylenol  and NSAIDs as needed.  No follow-ups on file.   Viola Greulich, MD

## 2024-04-14 ENCOUNTER — Other Ambulatory Visit: Payer: Self-pay | Admitting: Family Medicine

## 2024-04-14 DIAGNOSIS — I10 Essential (primary) hypertension: Secondary | ICD-10-CM

## 2024-05-01 ENCOUNTER — Other Ambulatory Visit: Payer: Self-pay | Admitting: Family Medicine

## 2024-05-01 DIAGNOSIS — E785 Hyperlipidemia, unspecified: Secondary | ICD-10-CM

## 2024-05-01 DIAGNOSIS — F419 Anxiety disorder, unspecified: Secondary | ICD-10-CM

## 2024-05-05 DIAGNOSIS — Z23 Encounter for immunization: Secondary | ICD-10-CM | POA: Diagnosis not present

## 2024-07-13 ENCOUNTER — Other Ambulatory Visit: Payer: Self-pay | Admitting: Family Medicine

## 2024-07-13 DIAGNOSIS — I10 Essential (primary) hypertension: Secondary | ICD-10-CM

## 2024-07-28 ENCOUNTER — Encounter: Payer: Self-pay | Admitting: Family Medicine

## 2024-08-03 ENCOUNTER — Encounter: Payer: Self-pay | Admitting: Gastroenterology

## 2024-08-07 ENCOUNTER — Ambulatory Visit: Admitting: Family Medicine

## 2024-08-07 ENCOUNTER — Encounter: Payer: Self-pay | Admitting: Family Medicine

## 2024-08-07 VITALS — BP 122/70 | HR 72 | Temp 97.9°F | Ht 69.0 in | Wt 340.6 lb

## 2024-08-07 DIAGNOSIS — I10 Essential (primary) hypertension: Secondary | ICD-10-CM | POA: Diagnosis not present

## 2024-08-07 DIAGNOSIS — F419 Anxiety disorder, unspecified: Secondary | ICD-10-CM | POA: Diagnosis not present

## 2024-08-07 DIAGNOSIS — E785 Hyperlipidemia, unspecified: Secondary | ICD-10-CM

## 2024-08-07 DIAGNOSIS — Z6841 Body Mass Index (BMI) 40.0 and over, adult: Secondary | ICD-10-CM | POA: Diagnosis not present

## 2024-08-07 DIAGNOSIS — Z0000 Encounter for general adult medical examination without abnormal findings: Secondary | ICD-10-CM

## 2024-08-07 LAB — CBC WITH DIFFERENTIAL/PLATELET
Basophils Absolute: 0 K/uL (ref 0.0–0.1)
Basophils Relative: 0.5 % (ref 0.0–3.0)
Eosinophils Absolute: 0.3 K/uL (ref 0.0–0.7)
Eosinophils Relative: 4.4 % (ref 0.0–5.0)
HCT: 44.8 % (ref 39.0–52.0)
Hemoglobin: 14.9 g/dL (ref 13.0–17.0)
Lymphocytes Relative: 35.8 % (ref 12.0–46.0)
Lymphs Abs: 2.4 K/uL (ref 0.7–4.0)
MCHC: 33.3 g/dL (ref 30.0–36.0)
MCV: 88.7 fl (ref 78.0–100.0)
Monocytes Absolute: 0.5 K/uL (ref 0.1–1.0)
Monocytes Relative: 6.9 % (ref 3.0–12.0)
Neutro Abs: 3.5 K/uL (ref 1.4–7.7)
Neutrophils Relative %: 52.4 % (ref 43.0–77.0)
Platelets: 299 K/uL (ref 150.0–400.0)
RBC: 5.05 Mil/uL (ref 4.22–5.81)
RDW: 13.2 % (ref 11.5–15.5)
WBC: 6.7 K/uL (ref 4.0–10.5)

## 2024-08-07 LAB — LIPID PANEL
Cholesterol: 147 mg/dL (ref 28–200)
HDL: 46.9 mg/dL
LDL Cholesterol: 80 mg/dL (ref 10–99)
NonHDL: 100.09
Total CHOL/HDL Ratio: 3
Triglycerides: 102 mg/dL (ref 10.0–149.0)
VLDL: 20.4 mg/dL (ref 0.0–40.0)

## 2024-08-07 LAB — COMPREHENSIVE METABOLIC PANEL WITH GFR
ALT: 26 U/L (ref 3–53)
AST: 25 U/L (ref 5–37)
Albumin: 4.4 g/dL (ref 3.5–5.2)
Alkaline Phosphatase: 44 U/L (ref 39–117)
BUN: 11 mg/dL (ref 6–23)
CO2: 31 meq/L (ref 19–32)
Calcium: 9.3 mg/dL (ref 8.4–10.5)
Chloride: 102 meq/L (ref 96–112)
Creatinine, Ser: 0.89 mg/dL (ref 0.40–1.50)
GFR: 101.48 mL/min
Glucose, Bld: 78 mg/dL (ref 70–99)
Potassium: 4.2 meq/L (ref 3.5–5.1)
Sodium: 138 meq/L (ref 135–145)
Total Bilirubin: 0.6 mg/dL (ref 0.2–1.2)
Total Protein: 7.4 g/dL (ref 6.0–8.3)

## 2024-08-07 LAB — HEMOGLOBIN A1C: Hgb A1c MFr Bld: 5.6 % (ref 4.6–6.5)

## 2024-08-07 LAB — TSH: TSH: 1.68 u[IU]/mL (ref 0.35–5.50)

## 2024-08-07 MED ORDER — LISINOPRIL 20 MG PO TABS: 20.0000 mg | ORAL_TABLET | Freq: Every day | ORAL | 3 refills | Status: AC

## 2024-08-07 NOTE — Progress Notes (Signed)
 ;  Established Patient Office Visit   Subjective  Patient ID: Jim Goodwin, male    DOB: 03/23/1976  Age: 49 y.o. MRN: [id number]  Chief Complaint  Patient presents with   Annual Exam    Pt is a 49 yo male seen for CPE.  Pt is not fasting, had turkey sausage and coffee with cream this am.  Pt notes poor sleep in the last few wks due to increased stress at work and with family.  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  Plans to restart some exercise.  Bp controlled at home, was 130/70s last night.    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 Removed xmas tree and was better   Anxiety 2014?   Seems to be ok currently with zoloft    Arthritis    Knee joint pain since mid teens.  Pinch grip aggravates hand   Complication of anesthesia    woke up early after anesthesia   Hyperlipidemia    Seems managed well with meds   Hypertension    Seems managed well with meds   Nephrolithiasis    Pneumonia 2013   Past Surgical History:  Procedure Laterality Date   COLONOSCOPY     HYDROCELE EXCISION     93-94   KNEE ARTHROSCOPY     left   Social History[1] Family History  Problem Relation Age of Onset   Cancer Mother    Hypertension Mother    Hyperlipidemia Mother    Arthritis Mother    Hearing loss Mother    Vision loss Mother    Kidney disease Father    Hearing loss Father    Colon cancer Paternal Uncle    Cancer Paternal Grandmother        Lung   Hyperlipidemia Paternal Grandmother    Hypertension Other     Hypertension Maternal Grandmother    ADD / ADHD Son    Cancer Paternal Uncle    Diabetes Paternal Uncle    Colon polyps Neg Hx    Esophageal cancer Neg Hx    Rectal cancer Neg Hx    Stomach cancer Neg Hx    Allergies[2]  ROS Negative unless stated above    Objective:     BP 122/70 (BP Location: Left Arm, Patient Position: Sitting, Cuff Size: Large)   Pulse 72   Temp 97.9 F (36.6 C) (Oral)   Ht 5' 9 (1.753 m)   Wt (!) 340 lb 9.6 oz (154.5 kg)   SpO2 99%   BMI 50.30 kg/m  BP Readings from Last 3 Encounters:  08/07/24 122/70  11/28/23 122/78  11/01/23 128/72   Wt Readings from Last 3 Encounters:  08/07/24 (!) 340 lb 9.6 oz (154.5 kg)  11/28/23 (!) 335 lb 12.8 oz (152.3 kg)  11/01/23 (!) 342 lb 4.9 oz (155.3 kg)      Physical Exam Constitutional:      Appearance: Normal appearance. He is obese.  HENT:     Head: Normocephalic and atraumatic.  Right Ear: Tympanic membrane, ear canal and external ear normal.     Left Ear: Tympanic membrane, ear canal and external ear normal.     Nose: Nose normal.     Mouth/Throat:     Mouth: Mucous membranes are moist.     Pharynx: No oropharyngeal exudate or posterior oropharyngeal erythema.  Eyes:     General: No scleral icterus.    Extraocular Movements: Extraocular movements intact.     Conjunctiva/sclera: Conjunctivae normal.     Pupils: Pupils are equal, round, and reactive to light.  Neck:     Thyroid : No thyromegaly.     Vascular: No carotid bruit.  Cardiovascular:     Rate and Rhythm: Normal rate and regular rhythm.     Pulses: Normal pulses.     Heart sounds: Normal heart sounds. No murmur heard.    No friction rub.  Pulmonary:     Effort: Pulmonary effort is normal.     Breath sounds: Normal breath sounds. No wheezing, rhonchi or rales.  Abdominal:     General: Bowel sounds are normal.     Palpations: Abdomen is soft.     Tenderness: There is no abdominal tenderness.  Musculoskeletal:        General: No  deformity. Normal range of motion.  Lymphadenopathy:     Cervical: No cervical adenopathy.  Skin:    General: Skin is warm and dry.     Findings: No lesion.  Neurological:     General: No focal deficit present.     Mental Status: He is alert and oriented to person, place, and time.  Psychiatric:        Mood and Affect: Mood normal.        Thought Content: Thought content normal.        08/07/2024   10:50 AM 05/08/2023   11:14 AM 05/08/2023   10:12 AM  Depression screen PHQ 2/9  Decreased Interest 0 0 0  Down, Depressed, Hopeless 0 1 1  PHQ - 2 Score 0 1 1  Altered sleeping 1 0 0  Tired, decreased energy 1 0 1  Change in appetite 0 0 0  Feeling bad or failure about yourself  1 1 1   Trouble concentrating 1 1 1   Moving slowly or fidgety/restless 1 0 0  Suicidal thoughts 0 0 0  PHQ-9 Score 5 3  4    Difficult doing work/chores Not difficult at all Not difficult at all Not difficult at all     Data saved with a previous flowsheet row definition      08/07/2024   10:50 AM 05/08/2023   10:12 AM 01/29/2020    8:40 AM 08/13/2018    9:09 AM  GAD 7 : Generalized Anxiety Score  Nervous, Anxious, on Edge 1 1 1 2   Control/stop worrying 1 1 1 2   Worry too much - different things 1 1 1 1   Trouble relaxing 1 0 1 1  Restless 0 1 0 0  Easily annoyed or irritable 0 0 1 0  Afraid - awful might happen 1 1 1 1   Total GAD 7 Score 5 5 6 7   Anxiety Difficulty Not difficult at all Not difficult at all Not difficult at all Not difficult at all     No results found for any visits on 08/07/24.    Assessment & Plan:   Well adult exam -     CBC with Differential/Platelet; Future -     Comprehensive metabolic panel with  GFR; Future -     Hemoglobin A1c; Future -     Lipid panel; Future -     TSH; Future  Essential hypertension -     TSH; Future -     Lisinopril ; Take 1 tablet (20 mg total) by mouth daily.  Dispense: 90 tablet; Refill: 3  Hyperlipidemia, unspecified hyperlipidemia  type -     Lipid panel; Future  Anxiety -     TSH; Future  Morbid obesity (HCC)   Appropriate health screenings discussed.  Obtain labs.  Immunizations reviewed.  COVID and flu vaccines done 03/2024 and in the community.  History of abnormal Cologuard.  Colonoscopy scheduled 08/2024.  BP well-controlled.  Continue lisinopril  20 mg.  Lifestyle modifications encouraged.  Continue Zocor  40 mg daily.  If cholesterol elevated this check advised patient was not fasting for labs.  Prior cholesterol on 05/08/2023 controlled as cholesterol 146, HDL 41.9, LDL 88, triglycerides 80.  Zaidi stable.  PHQ-9 score 5, GAD-7 score 5.  Continue Zoloft  50 mg daily. Body mass index is 50.3 kg/m.  Discussed importance of lifestyle modifications.  Patient encouraged to increase physical activity.  Consider weight loss medication for assistance/referral to weight management.  Return in about 1 year (around 08/07/2025) for physical and in 4-6 months for chronic conditions.   Clotilda JONELLE Single, MD     [1]  Social History Tobacco Use   Smoking status: Former    Current packs/day: 0.00    Average packs/day: 0.5 packs/day for 10.0 years (5.0 ttl pk-yrs)    Types: Cigarettes, Cigars    Quit date: 07/23/2005    Years since quitting: 19.0   Smokeless tobacco: Former  Building Services Engineer status: Never Used  Substance Use Topics   Alcohol use: Not Currently    Alcohol/week: 2.0 standard drinks of alcohol    Comment: maybe on a week   Drug use: Not Currently    Types: Marijuana, Psilocybin  [2]  Allergies Allergen Reactions   Duracillin As [Penicillin G Procaine]     GI upset   Other Rash    Allergy to wood/wood dust especially walnut.  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

## 2024-08-11 ENCOUNTER — Telehealth: Payer: Self-pay

## 2024-08-11 NOTE — Telephone Encounter (Signed)
 While preparing patient's chart for upcoming pre-visit prior to colonoscopy at [facility], RN observed that patient BMI as of 08/07/2024 is 50.27, above the approved limit for procedures at [facility].   RN contacted patient to let him know that the procedure at [facility] will be cancelled and Dr. Ira RN will contact him about rescheduling his procedure at the hospital.  Patient stated understanding.

## 2024-08-12 NOTE — Telephone Encounter (Signed)
 Patient had colonoscopy with you in May,  Needing repeat due top polyps,  but weight has crept up, he is just over the line at 50.2. Ok to schedule direct to hospital?

## 2024-08-13 NOTE — Telephone Encounter (Signed)
 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 RG

## 2024-08-14 ENCOUNTER — Telehealth: Payer: Self-pay | Admitting: *Deleted

## 2024-08-14 NOTE — Telephone Encounter (Signed)
 I had Jim Goodwin check your schedule.  Unless you want to do him during your hospital week, it'll be at least April until we can fit him on your schedule. Please advise.

## 2024-08-18 ENCOUNTER — Telehealth: Payer: Self-pay | Admitting: *Deleted

## 2024-08-18 NOTE — Telephone Encounter (Signed)
 Patient PV canceled. Is now on waiting list to be added to hospital, timeframe unknown Looking like spring.  Patient is aware.

## 2024-08-18 NOTE — Telephone Encounter (Signed)
 Team,  This pt's BMI is greater than 50; their procedure will need to be performed at the hospital.  Thanks,  Rogena Class

## 2024-08-18 NOTE — Telephone Encounter (Signed)
 POD B Please assist with this patient being rescheduled for the hospital Please let PV know if the patient's PV appt is rescheduled for a different date Please/thank you

## 2024-08-19 NOTE — Telephone Encounter (Signed)
 Lets have a spot for April for colonoscopy If any cancellation beforehand, please schedule him earlier RG

## 2024-08-19 NOTE — Telephone Encounter (Signed)
 You can put it on your hospital list thank you

## 2024-08-19 NOTE — Telephone Encounter (Signed)
 Hi Brooke- Dr Charlanne has advised we can wait until April for a hospital spot.  WIll you add him to your list and communicate when there is an opening?  Thanks!

## 2024-08-21 ENCOUNTER — Ambulatory Visit: Payer: Self-pay | Admitting: Family Medicine

## 2024-08-24 ENCOUNTER — Encounter

## 2024-09-08 ENCOUNTER — Encounter: Admitting: Gastroenterology
